# Patient Record
Sex: Male | Born: 1980 | Race: White | Hispanic: No | Marital: Single | Smoking: Current every day smoker
Health system: Southern US, Community
[De-identification: ages and names within clinical notes are randomized; demographics above are authoritative.]

## PROBLEM LIST (undated history)

## (undated) DIAGNOSIS — F329 Major depressive disorder, single episode, unspecified: Secondary | ICD-10-CM

## (undated) DIAGNOSIS — Z720 Tobacco use: Secondary | ICD-10-CM

## (undated) DIAGNOSIS — F32A Depression, unspecified: Secondary | ICD-10-CM

## (undated) DIAGNOSIS — F1010 Alcohol abuse, uncomplicated: Secondary | ICD-10-CM

## (undated) DIAGNOSIS — I10 Essential (primary) hypertension: Secondary | ICD-10-CM

## (undated) HISTORY — DX: Essential (primary) hypertension: I10

## (undated) HISTORY — DX: Major depressive disorder, single episode, unspecified: F32.9

## (undated) HISTORY — DX: Tobacco use: Z72.0

## (undated) HISTORY — DX: Depression, unspecified: F32.A

## (undated) HISTORY — DX: Alcohol abuse, uncomplicated: F10.10

---
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
## 2001-06-21 ENCOUNTER — Emergency Department (HOSPITAL_COMMUNITY): Admission: EM | Admit: 2001-06-21 | Discharge: 2001-06-21 | Payer: Self-pay | Admitting: Emergency Medicine

## 2006-08-11 ENCOUNTER — Emergency Department (HOSPITAL_COMMUNITY): Admission: EM | Admit: 2006-08-11 | Discharge: 2006-08-12 | Payer: Self-pay | Admitting: Emergency Medicine

## 2008-12-15 ENCOUNTER — Emergency Department (HOSPITAL_COMMUNITY): Admission: EM | Admit: 2008-12-15 | Discharge: 2008-12-15 | Payer: Self-pay | Admitting: Emergency Medicine

## 2010-08-19 LAB — RAPID URINE DRUG SCREEN, HOSP PERFORMED
Amphetamines: NOT DETECTED
Barbiturates: NOT DETECTED
Benzodiazepines: NOT DETECTED
Cocaine: NOT DETECTED
Opiates: NOT DETECTED
Tetrahydrocannabinol: POSITIVE — AB

## 2010-08-19 LAB — CBC
HCT: 48 % (ref 39.0–52.0)
Hemoglobin: 16.8 g/dL (ref 13.0–17.0)
MCHC: 35 g/dL (ref 30.0–36.0)
MCV: 88 fL (ref 78.0–100.0)
Platelets: 212 10*3/uL (ref 150–400)
RBC: 5.45 MIL/uL (ref 4.22–5.81)
RDW: 12.9 % (ref 11.5–15.5)
WBC: 5.7 10*3/uL (ref 4.0–10.5)

## 2010-08-19 LAB — DIFFERENTIAL
Basophils Absolute: 0 10*3/uL (ref 0.0–0.1)
Basophils Relative: 0 % (ref 0–1)
Eosinophils Absolute: 0.1 10*3/uL (ref 0.0–0.7)
Eosinophils Relative: 1 % (ref 0–5)
Lymphocytes Relative: 28 % (ref 12–46)
Lymphs Abs: 1.6 10*3/uL (ref 0.7–4.0)
Monocytes Absolute: 0.6 10*3/uL (ref 0.1–1.0)
Monocytes Relative: 10 % (ref 3–12)
Neutro Abs: 3.5 10*3/uL (ref 1.7–7.7)
Neutrophils Relative %: 61 % (ref 43–77)

## 2010-08-19 LAB — COMPREHENSIVE METABOLIC PANEL
ALT: 45 U/L (ref 0–53)
AST: 34 U/L (ref 0–37)
Albumin: 4.8 g/dL (ref 3.5–5.2)
Alkaline Phosphatase: 60 U/L (ref 39–117)
BUN: 7 mg/dL (ref 6–23)
CO2: 27 mEq/L (ref 19–32)
Calcium: 10 mg/dL (ref 8.4–10.5)
Chloride: 104 mEq/L (ref 96–112)
Creatinine, Ser: 0.92 mg/dL (ref 0.4–1.5)
GFR calc Af Amer: >60 mL/min (ref >60)
GFR calc non Af Amer: >60 mL/min (ref >60)
Glucose, Bld: 96 mg/dL (ref 70–99)
Potassium: 4.2 mEq/L (ref 3.5–5.1)
Sodium: 139 mEq/L (ref 135–145)
Total Bilirubin: 1 mg/dL (ref 0.3–1.2)
Total Protein: 7.5 g/dL (ref 6.0–8.3)

## 2010-08-19 LAB — ETHANOL: Alcohol, Ethyl (B): <5 mg/dL (ref 0–10)

## 2012-09-15 LAB — LIPID PANEL
Cholesterol: 240 — AB (ref 0–200)
HDL: 41 (ref 35–70)
LDL Cholesterol: 125
Triglycerides: 460 — AB (ref 40–160)

## 2012-09-15 LAB — HEPATIC FUNCTION PANEL
ALT: 55 — AB (ref 10–40)
AST: 26 (ref 14–40)
Alkaline Phosphatase: 56 (ref 25–125)
Bilirubin, Total: 0.6

## 2012-09-15 LAB — HM HIV SCREENING LAB: HM HIV Screening: NEGATIVE

## 2012-09-15 LAB — BASIC METABOLIC PANEL
BUN: 12 (ref 4–21)
Creatinine: 1 (ref 0.6–1.3)
Glucose: 92
Potassium: 4.4 (ref 3.4–5.3)
Sodium: 138 (ref 137–147)

## 2018-05-15 ENCOUNTER — Ambulatory Visit: Payer: Self-pay | Admitting: Family Medicine

## 2018-05-15 ENCOUNTER — Encounter: Payer: Self-pay | Admitting: Family Medicine

## 2018-05-15 VITALS — BP 122/84 | HR 91 | Temp 98.3°F | Ht 70.0 in | Wt 192.0 lb

## 2018-05-15 DIAGNOSIS — F419 Anxiety disorder, unspecified: Secondary | ICD-10-CM

## 2018-05-15 DIAGNOSIS — I10 Essential (primary) hypertension: Secondary | ICD-10-CM

## 2018-05-15 DIAGNOSIS — Z7289 Other problems related to lifestyle: Secondary | ICD-10-CM

## 2018-05-15 DIAGNOSIS — F17200 Nicotine dependence, unspecified, uncomplicated: Secondary | ICD-10-CM | POA: Insufficient documentation

## 2018-05-15 DIAGNOSIS — F1090 Alcohol use, unspecified, uncomplicated: Secondary | ICD-10-CM | POA: Insufficient documentation

## 2018-05-15 DIAGNOSIS — R079 Chest pain, unspecified: Secondary | ICD-10-CM

## 2018-05-15 DIAGNOSIS — J329 Chronic sinusitis, unspecified: Secondary | ICD-10-CM

## 2018-05-15 DIAGNOSIS — Z789 Other specified health status: Secondary | ICD-10-CM | POA: Insufficient documentation

## 2018-05-15 MED ORDER — PROPRANOLOL HCL 10 MG PO TABS: 10.0000 mg | ORAL_TABLET | Freq: Three times a day (TID) | ORAL | 1 refills | Status: DC | PRN

## 2018-05-15 MED ORDER — AMLODIPINE BESYLATE 10 MG PO TABS: 10.0000 mg | ORAL_TABLET | Freq: Every day | ORAL | 3 refills | Status: DC

## 2018-05-15 NOTE — Assessment & Plan Note (Signed)
Continue Zoloft 50 mg daily.  Will start low-dose propranolol milligrams 3 times daily as needed for anxiety provoking situations.  Recommended against benzodiazepines.

## 2018-05-15 NOTE — Assessment & Plan Note (Signed)
Patient smokes about a pack per day.  He is pre-contemplative.  Encouraged cessation today.

## 2018-05-15 NOTE — Assessment & Plan Note (Signed)
At goal today.  Home readings typically can be into the 130s to 140s over 80s to 90s.  We will increase his Norvasc to 10 mg daily.  Discussed potential side effects.  He will continue home blood pressure monitoring with goal 140/90 or lower.

## 2018-05-15 NOTE — Assessment & Plan Note (Signed)
Encourage cessation.  Patient is pre-contemplative.

## 2018-05-15 NOTE — Patient Instructions (Signed)
It was very nice to see you today!  Please start the amlodipine 10mg  daily. I will send in propranolol for you to use as needed.  Your EKG today is normal. You do not have any signs of heart daage. We should probably recheck your cholesterol levels soon.  Keep an eye on your BPs and let me know if persistently 140/90 or higher.  Come back to see me in 6-12 months, or sooner as needed.   Take care, Dr [name]

## 2018-05-15 NOTE — Progress Notes (Signed)
Subjective:  Brent Washington is a 38 y.o. male who presents today with a chief complaint of hypertension and to establish care.   HPI:  Hypertension, chronic problem, new to provider Patient has a several year history of elevated blood pressure.  He has been off his medication for several months.  Recently had to have dental work done was not able to get done due to elevated blood pressure readings.  He was evaluated at the emergency department about 6 weeks ago and was restarted on amlodipine 5 mg daily.  He has done well with this without reported side effects.  He has had a few intermittent episodes of left-sided chest pain.  This occurs randomly.  Not associated with exertion.  No nausea or vomiting.  No shortness of breath.  No treatments tried.  Anxiety, chronic problem, new to provider Follows with [facility] for this.  He is currently on Zoloft 50 mg daily.  He has not been able to tolerate higher doses due to sexual side effects.  He notices that symptoms seem to worsen when he is in specific situations such as driving along [place] roads.  He is interested in starting an as needed medication.  He has been on Xanax in the past which worked well for him.  Sinus Congestion Started about a day ago.  Improving over that time.  No fevers or chills.  No treatments tried.  Nicotine dependence Patient smokes about 1 pack/day.  He is not interested in quitting.  Alcohol use Patient also drinks 5-6 drinks per day.  He is not interested in cutting back.  Does not feel guilty.  Does not need an eye opener.  ROS: Per HPI, otherwise a complete review of systems was negative.   PMH:  The following were reviewed and entered/updated in epic: Past Medical History:  Diagnosis Date  . Depression   . Hypertension    Patient Active Problem List   Diagnosis Date Noted  . Essential hypertension 05/15/2018  . Anxiety 05/15/2018  . Nicotine dependence with current use 05/15/2018  . Alcohol  use 05/15/2018   History reviewed. No pertinent surgical history.  Family History  Problem Relation Age of Onset  . Hypertension Mother   . Depression Mother   . Early death Father   . Heart disease Father   . Hypertension Father     Medications- reviewed and updated Current Outpatient Medications  Medication Sig Dispense Refill  . sertraline (ZOLOFT) 50 MG tablet Take 50 mg by mouth daily.    Marland Kitchen amLODipine (NORVASC) 10 MG tablet Take 1 tablet (10 mg total) by mouth daily. 90 tablet 3  . propranolol (INDERAL) 10 MG tablet Take 1 tablet (10 mg total) by mouth 3 (three) times daily as needed (anxiety). 90 tablet 1   No current facility-administered medications for this visit.     Allergies-reviewed and updated No Known Allergies  Social History   Socioeconomic History  . Marital status: Single    Spouse name: Not on file  . Number of children: Not on file  . Years of education: Not on file  . Highest education level: Not on file  Occupational History  . Not on file  Social Needs  . Financial resource strain: Not on file  . Food insecurity:    Worry: Not on file    Inability: Not on file  . Transportation needs:    Medical: Not on file    Non-medical: Not on file  Tobacco Use  .  Smoking status: Current Every Day Smoker  . Smokeless tobacco: Current User    Types: Snuff, Chew  Substance and Sexual Activity  . Alcohol use: Yes    Alcohol/week: 20.0 standard drinks    Types: 20 Shots of liquor per week  . Drug use: Never  . Sexual activity: Yes  Lifestyle  . Physical activity:    Days per week: Not on file    Minutes per session: Not on file  . Stress: Not on file  Relationships  . Social connections:    Talks on phone: Not on file    Gets together: Not on file    Attends religious service: Not on file    Active member of club or organization: Not on file    Attends meetings of clubs or organizations: Not on file    Relationship status: Not on file  Other  Topics Concern  . Not on file  Social History Narrative  . Not on file     Objective:  Physical Exam: BP 122/84 (BP Location: Left Arm, Patient Position: Sitting, Cuff Size: Large)   Pulse 91   Temp 98.3 F (36.8 C) (Oral)   Ht 5\' 10"  (1.778 m)   Wt 192 lb (87.1 kg)   SpO2 98%   BMI 27.55 kg/m   Gen: NAD, resting comfortably HEENT: TMs clear.  Nose mucosa slightly erythematous with no exudate. CV: RRR with no murmurs appreciated Pulm: NWOB, CTAB with no crackles, wheezes, or rhonchi GI: Normal bowel sounds present. Soft, Nontender, Nondistended. MSK: No edema, cyanosis, or clubbing noted Skin: Warm, dry Neuro: Grossly normal, moves all extremities Psych: Normal affect and thought content  EKG: Normal sinus rhythm.  No ischemic changes.  Assessment/Plan:  Nicotine dependence with current use Patient smokes about a pack per day.  He is pre-contemplative.  Encouraged cessation today.  Essential hypertension At goal today.  Home readings typically can be into the 130s to 140s over 80s to 90s.  We will increase his Norvasc to 10 mg daily.  Discussed potential side effects.  He will continue home blood pressure monitoring with goal 140/90 or lower.  Anxiety Continue Zoloft 50 mg daily.  Will start low-dose propranolol milligrams 3 times daily as needed for anxiety provoking situations.  Recommended against benzodiazepines.  Alcohol use Encourage cessation.  Patient is pre-contemplative.  Chest pain Atypical history.  EKG today reassuring.  Unlikely to be cardiac based on history and lack of other risk factors.  Possibly musculoskeletal or anxiety driven.  Discussed reasons to return to care and seek emergent care.  Sinusitis Likely viral URI.  No red flags.  Continue with watchful waiting.  Dissipate full recovery in the next 5 to 7 days.  Katina Degree. [name], MD 05/15/2018 5:39 PM

## 2018-05-25 ENCOUNTER — Ambulatory Visit: Payer: Self-pay | Admitting: Family Medicine

## 2018-06-02 ENCOUNTER — Encounter: Payer: Self-pay | Admitting: Physical Therapy

## 2018-07-08 ENCOUNTER — Other Ambulatory Visit: Payer: Self-pay | Admitting: Family Medicine

## 2018-07-08 MED ORDER — SERTRALINE HCL 50 MG PO TABS: 50.0000 mg | ORAL_TABLET | Freq: Every day | ORAL | 1 refills | Status: DC

## 2018-07-08 NOTE — Telephone Encounter (Signed)
Copied from CRM [id number] [id number]. Topic: Quick Communication - Rx Refill/Question >> Jul 08, 2018  8:56 AM Fanny Bien wrote: Medication: sertraline (ZOLOFT) 50 MG tablet [[id number]]   Has the patient contacted their pharmacy? no Preferred Pharmacy (with phone number or street name):[facility] [address] [place], [place] - [address] [phone] (Phone) [phone] (Fax)    Agent: Please be advised that RX refills may take up to 3 business days. We ask that you follow-up with your pharmacy.

## 2018-07-08 NOTE — Telephone Encounter (Signed)
Requested medication (s) are due for refill today -yes  Requested medication (s) are on the active medication list -yes  Future visit scheduled -no  Last refill: 1 month ago   Notes to clinic: Patient is requesting refill of historical medication- sent to PCP for review of request  Requested Prescriptions  Pending Prescriptions Disp Refills   sertraline (ZOLOFT) 50 MG tablet      Sig: Take 1 tablet (50 mg total) by mouth daily.     Psychiatry:  Antidepressants - SSRI Passed - 07/08/2018  8:59 AM      Passed - Valid encounter within last 6 months    Recent Outpatient Visits          1 month ago Chest pain, unspecified type   [facility], Katina Degree, MD              Requested Prescriptions  Pending Prescriptions Disp Refills   sertraline (ZOLOFT) 50 MG tablet      Sig: Take 1 tablet (50 mg total) by mouth daily.     Psychiatry:  Antidepressants - SSRI Passed - 07/08/2018  8:59 AM      Passed - Valid encounter within last 6 months    Recent Outpatient Visits          1 month ago Chest pain, unspecified type   [facility], Katina Degree, MD

## 2018-07-08 NOTE — Telephone Encounter (Signed)
See note

## 2018-10-19 ENCOUNTER — Encounter: Payer: Self-pay | Admitting: Family Medicine

## 2018-10-19 ENCOUNTER — Ambulatory Visit (INDEPENDENT_AMBULATORY_CARE_PROVIDER_SITE_OTHER): Payer: HRSA Program | Admitting: Family Medicine

## 2018-10-19 ENCOUNTER — Ambulatory Visit: Payer: Self-pay

## 2018-10-19 VITALS — Temp 98.3°F

## 2018-10-19 DIAGNOSIS — Z20822 Contact with and (suspected) exposure to covid-19: Secondary | ICD-10-CM

## 2018-10-19 DIAGNOSIS — Z20828 Contact with and (suspected) exposure to other viral communicable diseases: Secondary | ICD-10-CM

## 2018-10-19 DIAGNOSIS — R509 Fever, unspecified: Secondary | ICD-10-CM

## 2018-10-19 NOTE — Telephone Encounter (Signed)
FYI

## 2018-10-19 NOTE — Telephone Encounter (Signed)
Pt called stating that he has had a positive exposure to COVID-19. He states he has been camping with this person. Last contact was yesterday. He has a cough, sweats, unsure of fever. SOB. Per protocol call was transferred to office for scheduling. Care advice was read to patient. Patient verbalized understanding of all instructions.  Reason for Disposition . [1] COVID-19 infection suspected by caller or triager AND [2] mild symptoms (cough, fever, or others) AND [6] no complications or SOB  Answer Assessment - Initial Assessment Questions 1. CLOSE CONTACT: "Who is the person with the confirmed or suspected COVID-19 infection that you were exposed to?"     friend 2. PLACE of CONTACT: "Where were you when you were exposed to COVID-19?" (e.g., home, school, medical waiting room; which city?)     home 3. TYPE of CONTACT: "How much contact was there?" (e.g., sitting next to, live in same house, work in same office, same building)    Sitting with eating with person 4. DURATION of CONTACT: "How long were you in contact with the COVID-19 patient?" (e.g., a few seconds, passed by person, a few minutes, live with the patient)     Several days camping trip 5. DATE of CONTACT: "When did you have contact with a COVID-19 patient?" (e.g., how many days ago)    yesterday 6. TRAVEL: "Have you traveled out of the country recently?" If so, "When and where?"     * Also ask about out-of-state travel, since the CDC has identified some high-risk cities for community spread in the [place].     * Note: Travel becomes less relevant if there is widespread community transmission where the patient lives.    No 7. COMMUNITY SPREAD: "Are there lots of cases of COVID-19 (community spread) where you live?" (See public health department website, if unsure)      [place] 8. SYMPTOMS: "Do you have any symptoms?" (e.g., fever, cough, breathing difficulty)    Yes all 9. PREGNANCY OR POSTPARTUM: "Is there any chance you are  pregnant?" "When was your last menstrual period?" "Did you deliver in the last 2 weeks?"     N/A 10. HIGH RISK: "Do you have any heart or lung problems? Do you have a weak immune system?" (e.g., CHF, COPD, asthma, HIV positive, chemotherapy, renal failure, diabetes mellitus, sickle cell anemia)       No  Answer Assessment - Initial Assessment Questions 1. COVID-19 DIAGNOSIS: "Who made your Coronavirus (COVID-19) diagnosis?" "Was it confirmed by a positive lab test?" If not diagnosed by a HCP, ask "Are there lots of cases (community spread) where you live?" (See public health department website, if unsure)     yes 2. ONSET: "When did the COVID-19 symptoms start?"      2 days 3. WORST SYMPTOM: "What is your worst symptom?" (e.g., cough, fever, shortness of breath, muscle aches)     Fever  4. COUGH: "Do you have a cough?" If so, ask: "How bad is the cough?"       yes 5. FEVER: "Do you have a fever?" If so, ask: "What is your temperature, how was it measured, and when did it start?"     Just sweats 6. RESPIRATORY STATUS: "Describe your breathing?" (e.g., shortness of breath, wheezing, unable to speak)      wheezes 7. BETTER-SAME-WORSE: "Are you getting better, staying the same or getting worse compared to yesterday?"  If getting worse, ask, "In what way?"     same 8. HIGH RISK DISEASE: "  Do you have any chronic medical problems?" (e.g., asthma, heart or lung disease, weak immune system, etc.)     No 9. PREGNANCY: "Is there any chance you are pregnant?" "When was your last menstrual period?"     N/A 10. OTHER SYMPTOMS: "Do you have any other symptoms?"  (e.g., chills, fatigue, headache, loss of smell or taste, muscle pain, sore throat)      no  Protocols used: CORONAVIRUS (COVID-19) DIAGNOSED OR SUSPECTED-A-AH, CORONAVIRUS (COVID-19) EXPOSURE-A-AH

## 2018-10-19 NOTE — Progress Notes (Signed)
    Chief Complaint:  Brent Washington is a 38 y.o. male who presents for a telephone visit with a chief complaint of fever.   Assessment/Plan:  Fever/COVID-19 exposure Symptoms consistent with COVID-19 infection.  Discussed supportive care and reasons to return to care/seek emergent care.  Will send for COVID-19 testing.    Subjective:  HPI:  Fever Started about a week ago. Was recently on a camping trip with friends and one was recently tested positive for COVID.  No cough or shortness of breath.  Some swelling.  Also some increasing fatigue.  No treatments tried.  ROS: Per HPI  PMH: He reports that he has been smoking. His smokeless tobacco use includes snuff and chew. He reports current alcohol use of about 20.0 standard drinks of alcohol per week. He reports that he does not use drugs.      Objective/Observations   NAD, speaking in full sentences.   Telephone Visit   I connected with Brent Washington on 10/19/18 at  3:40 PM EDT via telephone and verified that I am speaking with the correct person using two identifiers. I discussed the limitations of evaluation and management by telemedicine and the availability of in person appointments. The patient expressed understanding and agreed to proceed.   Patient location: Home Provider location: [place] participating in the virtual visit: Myself and Patient     Algis Greenhouse. [name], MD 10/19/2018 3:55 PM

## 2018-10-19 NOTE — Telephone Encounter (Signed)
See note. Patient scheduled for today.

## 2018-10-20 ENCOUNTER — Telehealth: Payer: Self-pay | Admitting: *Deleted

## 2018-10-20 DIAGNOSIS — Z20822 Contact with and (suspected) exposure to covid-19: Secondary | ICD-10-CM

## 2018-10-20 NOTE — Telephone Encounter (Signed)
Patient is calling to report he had visit yesterday and was supposed to have testing ordered- per OV note- he is correct- testing ordered per note. PCP will be made aware of call.

## 2018-10-20 NOTE — Telephone Encounter (Signed)
See note

## 2018-10-21 ENCOUNTER — Other Ambulatory Visit: Payer: Self-pay

## 2018-10-21 DIAGNOSIS — Z20822 Contact with and (suspected) exposure to covid-19: Secondary | ICD-10-CM

## 2018-10-21 NOTE — Telephone Encounter (Signed)
Noted  

## 2018-10-22 LAB — NOVEL CORONAVIRUS, NAA: SARS-CoV-2, NAA: NOT DETECTED

## 2018-10-27 ENCOUNTER — Ambulatory Visit: Payer: Self-pay | Admitting: Family Medicine

## 2018-10-27 ENCOUNTER — Telehealth: Payer: Self-pay | Admitting: Family Medicine

## 2018-10-27 NOTE — Telephone Encounter (Signed)
Needs letter to go back to work after testing negative for the COVID-19.  Line was busy at the office with 4 attempts so note sent to the office for them to call him back.   Phone number verified as correct in chart.  I sent this note to Dr. Marigene Ehlers office.

## 2018-10-27 NOTE — Telephone Encounter (Signed)
Patient called and asked could he receive a copy of his covid results via email for his job, I advised no email due to security and privacy. He asked could he pick a copy up at the office, I called and spoke to [place], [facility] and she says to let the patient know to come before 4 pm tomorrow and to wear a mask. I advised the patient, he verbalized understanding.

## 2018-10-28 NOTE — Telephone Encounter (Signed)
Work excuse and lab work printed and completed and ready for pick up

## 2018-12-10 ENCOUNTER — Telehealth: Payer: Self-pay

## 2018-12-10 ENCOUNTER — Ambulatory Visit: Payer: Self-pay | Admitting: Internal Medicine

## 2018-12-10 NOTE — Telephone Encounter (Signed)
I spoke with patient and he informed me that he has been working in the sun for the past few days.  Patient explained that he is having some lightheadedness and light chest pains,  No SOB and no dizziness.   Patient is currently at home today laying down out of the sun.  Patient said that he is also feeling tired.  I informed pt to go to the Urgent Care for further evaluation of symptoms.

## 2018-12-10 NOTE — Telephone Encounter (Signed)
Please ask pt to schedule OV ASAP.  Brent Washington. [name], MD 12/10/2018 4:44 PM

## 2018-12-11 NOTE — Telephone Encounter (Signed)
Left voice message for patient to call clinic to schedule appt

## 2018-12-14 ENCOUNTER — Other Ambulatory Visit: Payer: Self-pay

## 2018-12-14 ENCOUNTER — Telehealth: Payer: Self-pay

## 2018-12-14 DIAGNOSIS — R9431 Abnormal electrocardiogram [ECG] [EKG]: Secondary | ICD-10-CM

## 2018-12-14 NOTE — Telephone Encounter (Signed)
Ok with me. Please place any necessary orders. 

## 2018-12-14 NOTE — Telephone Encounter (Signed)
Spoke with patient he was seen at [facility] urgent care,His EKG stated Incomplete right bundle branch block. They informed him to see a cardiologist.Patient wants a referral then he will follow up with Dr. [name] after seeing the specialist. Also medication was changed to amlodipine-benazepril.

## 2018-12-14 NOTE — Telephone Encounter (Signed)
Copied from [place] [id number] [id number]. Topic: General - Other >> Dec 10, 2018 10:14 AM Keene Breath wrote: Reason for CRM: Patient called to schedule an appt. With Dr. [name].  Tried the office but there was no answer.  Please call patient to schedule

## 2018-12-14 NOTE — Telephone Encounter (Signed)
Referral placed.

## 2018-12-14 NOTE — Telephone Encounter (Signed)
Patient said someone has handled this for him already. No further action required. No need to route

## 2019-01-03 NOTE — Progress Notes (Signed)
Cardiology Office Note:   Date:  01/04/2019  NAME:  Brent ApleyChristopher J Washington    MRN: [id number] DOB:  03/24/1981   PCP:  Ardith DarkParker, Caleb M, MD  Cardiologist:  Reatha HarpsWesley T O'Neal, MD  Electrophysiologist:  None   Referring MD: Ardith DarkParker, Caleb M, MD   Chief Complaint  Patient presents with   Chest Pain    History of Present Illness:   Brent Washington is a 38 y.o. male with a hx of HTN, depression, tobacco abuse, etoh abuse who is being seen today for the evaluation of chest pain at the request of Ardith DarkParker, Caleb M, MD.  He reports he was seen in urgent care 6 weeks ago for an episode of sharp substernal chest pain.  The pain reported to occur while he was working on [employer] site during a hot day.  The pain lasted with him constantly for 4 days and then subsided with blood pressure medication.  He reports he was taking amlodipine 10 mg daily prior to this.  He was recently started on amlodipine and benazepril combination therapy.  He reports no associated shortness of breath.  He states he was able to continue working in [employer], doing heavy lifting and walking up stairs without any limitations.  He does have a rather heavy history of smoking nearly 1 pack a day for 20 years.  He also consumes 5-6 alcoholic beverages daily, but describes himself as not an alcoholic.  His blood pressure has been relatively labile, but seems to be much better today since he has refrained from alcohol over the weekend.  He reports a father who had a heart attack in his early 3460s as well as an uncle who had a heart attack in his 3850s.  He reports concern over the chest pain he had as well as his family history of myocardial infarction.  His ECG today shows an RSR' pattern, which is a normal finding.  There is no evidence of prior myocardial infarction or ischemic changes on ECG.  Of note his chest pain is resolved and has had no further episodes.  He does report issues with anxiety, depression and insomnia.  He is being  treated by his primary care physician.  Past Medical History: Past Medical History:  Diagnosis Date   Depression    Hypertension     Past Surgical History: History reviewed. No pertinent surgical history.  Current Medications: Current Meds  Medication Sig   amLODipine-benazepril (LOTREL) 10-20 MG capsule TAKE 1 CAPSULE BY MOUTH ONCE DAILY FOR 30 DAYS   aspirin EC 81 MG tablet Take by mouth.   sertraline (ZOLOFT) 50 MG tablet Take 1 tablet (50 mg total) by mouth daily.     Allergies:    Wellbutrin [bupropion]   Social History: Social History   Socioeconomic History   Marital status: Single    Spouse name: Not on file   Number of children: Not on file   Years of education: Not on file   Highest education level: Not on file  Occupational History   Not on file  Social Needs   Financial resource strain: Not on file   Food insecurity    Worry: Not on file    Inability: Not on file   Transportation needs    Medical: Not on file    Non-medical: Not on file  Tobacco Use   Smoking status: Current Every Day Smoker    Years: 20.00   Smokeless tobacco: Current User    Types: Snuff, Dorna BloomChew  Substance and Sexual Activity   Alcohol use: Yes    Alcohol/week: 20.0 standard drinks    Types: 20 Shots of liquor per week   Drug use: Never   Sexual activity: Yes  Lifestyle   Physical activity    Days per week: Not on file    Minutes per session: Not on file   Stress: Not on file  Relationships   Social connections    Talks on phone: Not on file    Gets together: Not on file    Attends religious service: Not on file    Active member of club or organization: Not on file    Attends meetings of clubs or organizations: Not on file    Relationship status: Not on file  Other Topics Concern   Not on file  Social History Narrative   Not on file     Family History: The patient's family history includes Depression in his mother; Early death in his father;  Heart disease in his father and maternal uncle; Hypertension in his father and mother.  ROS:   All other ROS reviewed and negative. Pertinent positives noted in the HPI.     EKGs/Labs/Other Studies Reviewed:   The following studies were personally reviewed by me today:  EKG:  EKG is ordered today.  The ekg ordered today demonstrates normal sinus rhythm, heart rate 91, normal intervals, rSR' pattern, no ST-T changes to suggest ischemia no infarction, and was personally reviewed by me.   Recent Labs: No results found for requested labs within last 8760 hours.   Recent Lipid Panel    Component Value Date/Time   CHOL 240 (A) 09/15/2012   TRIG 460 (A) 09/15/2012   HDL 41 09/15/2012   LDLCALC 125 09/15/2012    Physical Exam:   VS:  BP 130/76    Pulse 91    Ht 5\' 10"  (1.778 m)    Wt 184 lb 12.8 oz (83.8 kg)    SpO2 97%    BMI 26.52 kg/m    Wt Readings from Last 3 Encounters:  01/04/19 184 lb 12.8 oz (83.8 kg)  05/15/18 192 lb (87.1 kg)    General: Well nourished, well developed, in no acute distress Heart: Atraumatic, normal size  Eyes: PEERLA, EOMI  Neck: Supple, no JVD Endocrine: No thryomegaly Cardiac: Normal S1, S2; RRR; no murmurs, rubs, or gallops Lungs: Clear to auscultation bilaterally, no wheezing, rhonchi or rales  Abd: Soft, nontender, no hepatomegaly  Ext: No edema, pulses 2+ Musculoskeletal: No deformities, BUE and BLE strength normal and equal Skin: Warm and dry, no rashes   Neuro: Alert and oriented to person, place, time, and situation, CNII-XII grossly intact, no focal deficits  Psych: Normal mood and affect   ASSESSMENT:   NAME@ is a 38 y.o. male who presents for the following: 1. Chest pain of uncertain etiology   2. Nonspecific abnormal electrocardiogram (ECG) (EKG)   3. Tobacco abuse   4. Alcohol abuse   5. Essential hypertension     PLAN:   1. Chest pain of uncertain etiology 2. Nonspecific abnormal electrocardiogram (ECG) (EKG) -He presents with  rather atypical chest pain, that has resolved.  It seems to be related to the stress of the hot day, as well as his known anxiety/depression disorder.  He has had no further recurrence of this chest pain, and maintains a rather high level of exertional capacity without symptoms of chest pain, shortness of breath or other symptoms.  His ECG shows an  RSR prime pattern, which is a normal variant and of no underlying pathological consequence.  He has no ischemic changes or evidence of prior infarction on his ECG today.  He is examination is extremely benign, and I hear no murmurs rubs or gallops.  He has no evidence of underlying congestive heart failure on my examination. -I explained to him thoroughly that his symptoms are likely related to physical stress including mental stress and I have encouraged him to continue exercise and to destress as much as possible -I will see him on an as-needed basis if his symptoms return or if he has further concerns  3. Tobacco abuse -Smoking cessation counseling provided today  4. Alcohol abuse -He is drinking nearly 5-6 alcoholic beverages per day and this is too much -This is likely contributing to his hypertension and he was advised to cut down immediately  5. Essential hypertension -I suspect this is secondary to tobacco abuse as well as alcohol abuse; cessation advised   Disposition: Return if symptoms worsen or fail to improve.  Medication Adjustments/Labs and Tests Ordered: Current medicines are reviewed at length with the patient today.  Concerns regarding medicines are outlined above.  Orders Placed This Encounter  Procedures   EKG 12-Lead   No orders of the defined types were placed in this encounter.  Patient Instructions  Medication Instructions:  No changes  Lab work:  Not needed  Testing/Procedures: Not needed  Follow-Up: At [facility], you and your health needs are our priority.  As part of our continuing mission to provide  you with exceptional heart care, we have created designated Provider Care Teams.  These Care Teams include your primary Cardiologist (physician) and Advanced Practice Providers (APPs -  Physician Assistants and Nurse Practitioners) who all work together to provide you with the care you need, when you need it.  Dr Audie Box recommends that you schedule a follow-up appointment on an as needed basis     Any Other Special Instructions Will Be Listed Below. n/a    Carolann Littler T. Audie Box, Bray  [address], [place], [place] [address] [phone]  01/04/2019 3:36 PM

## 2019-01-04 ENCOUNTER — Ambulatory Visit (INDEPENDENT_AMBULATORY_CARE_PROVIDER_SITE_OTHER): Payer: Self-pay | Admitting: Cardiovascular Disease

## 2019-01-04 ENCOUNTER — Other Ambulatory Visit: Payer: Self-pay

## 2019-01-04 ENCOUNTER — Encounter: Payer: Self-pay | Admitting: Cardiovascular Disease

## 2019-01-04 VITALS — BP 130/76 | HR 91 | Ht 70.0 in | Wt 184.8 lb

## 2019-01-04 DIAGNOSIS — R9431 Abnormal electrocardiogram [ECG] [EKG]: Secondary | ICD-10-CM

## 2019-01-04 DIAGNOSIS — R0789 Other chest pain: Secondary | ICD-10-CM

## 2019-01-04 DIAGNOSIS — Z720 Tobacco use: Secondary | ICD-10-CM

## 2019-01-04 DIAGNOSIS — R079 Chest pain, unspecified: Secondary | ICD-10-CM

## 2019-01-04 DIAGNOSIS — F1010 Alcohol abuse, uncomplicated: Secondary | ICD-10-CM

## 2019-01-04 DIAGNOSIS — I10 Essential (primary) hypertension: Secondary | ICD-10-CM

## 2019-01-04 NOTE — Patient Instructions (Signed)
Medication Instructions:  No changes  Lab work:  Not needed  Testing/Procedures: Not needed  Follow-Up: At [facility], you and your health needs are our priority.  As part of our continuing mission to provide you with exceptional heart care, we have created designated Provider Care Teams.  These Care Teams include your primary Cardiologist (physician) and Advanced Practice Providers (APPs -  Physician Assistants and Nurse Practitioners) who all work together to provide you with the care you need, when you need it. . Dr Audie Box recommends that you schedule a follow-up appointment on an as needed basis     Any Other Special Instructions Will Be Listed Below. n/a

## 2019-02-08 ENCOUNTER — Ambulatory Visit: Payer: Self-pay | Admitting: Cardiology

## 2019-03-01 ENCOUNTER — Other Ambulatory Visit: Payer: Self-pay | Admitting: Family Medicine

## 2019-05-01 ENCOUNTER — Other Ambulatory Visit: Payer: Self-pay | Admitting: Family Medicine

## 2019-05-19 ENCOUNTER — Other Ambulatory Visit: Payer: Self-pay

## 2019-05-19 ENCOUNTER — Other Ambulatory Visit: Payer: Self-pay | Admitting: Family Medicine

## 2019-05-19 MED ORDER — AMLODIPINE BESYLATE 10 MG PO TABS: 10.0000 mg | ORAL_TABLET | Freq: Every day | ORAL | 0 refills | Status: DC

## 2019-08-22 ENCOUNTER — Other Ambulatory Visit: Payer: Self-pay | Admitting: Family Medicine

## 2019-11-03 ENCOUNTER — Other Ambulatory Visit: Payer: Self-pay | Admitting: Family Medicine

## 2019-11-04 MED ORDER — SERTRALINE HCL 50 MG PO TABS: 50.0000 mg | ORAL_TABLET | Freq: Every day | ORAL | 0 refills | Status: DC

## 2019-11-04 NOTE — Addendum Note (Signed)
Addended by: Jimmye Norman on: 11/04/2019 09:47 AM   Modules accepted: Orders

## 2019-11-04 NOTE — Telephone Encounter (Signed)
  LAST APPOINTMENT DATE: 10/19/2018  NEXT APPOINTMENT DATE:@Visit  date not found  MEDICATION: amLODipine-benazepril (LOTREL) 10-20 MG capsule // sertraline (ZOLOFT) 50 MG tablet  PHARMACY: [facility] [address] [place], [place] - [address]

## 2020-02-08 ENCOUNTER — Other Ambulatory Visit: Payer: Self-pay | Admitting: Family Medicine

## 2020-02-08 NOTE — Telephone Encounter (Signed)
LAST APPOINTMENT DATE: 608/2020   NEXT APPOINTMENT DATE: Visit date not found   Rx Zoloft LAST REFILL: 11/04/2019  QTY:30 Ref0

## 2020-02-14 ENCOUNTER — Other Ambulatory Visit: Payer: Self-pay | Admitting: Family Medicine

## 2020-02-15 NOTE — Telephone Encounter (Signed)
Pt has not been seen since 10/2018.

## 2020-09-05 ENCOUNTER — Emergency Department (HOSPITAL_COMMUNITY)
Admission: EM | Admit: 2020-09-05 | Discharge: 2020-09-05 | Disposition: A | Discharge status: Home / Self Care | Payer: Self-pay | Attending: Emergency Medicine | Admitting: Emergency Medicine

## 2020-09-05 ENCOUNTER — Emergency Department (HOSPITAL_COMMUNITY): Payer: Self-pay

## 2020-09-05 ENCOUNTER — Encounter (HOSPITAL_COMMUNITY): Payer: Self-pay | Admitting: *Deleted

## 2020-09-05 DIAGNOSIS — J181 Lobar pneumonia, unspecified organism: Secondary | ICD-10-CM | POA: Insufficient documentation

## 2020-09-05 DIAGNOSIS — Z79899 Other long term (current) drug therapy: Secondary | ICD-10-CM | POA: Insufficient documentation

## 2020-09-05 DIAGNOSIS — J189 Pneumonia, unspecified organism: Secondary | ICD-10-CM

## 2020-09-05 DIAGNOSIS — R0789 Other chest pain: Secondary | ICD-10-CM

## 2020-09-05 DIAGNOSIS — I10 Essential (primary) hypertension: Secondary | ICD-10-CM | POA: Insufficient documentation

## 2020-09-05 DIAGNOSIS — F17200 Nicotine dependence, unspecified, uncomplicated: Secondary | ICD-10-CM | POA: Insufficient documentation

## 2020-09-05 LAB — CBC
HCT: 50.8 % (ref 39.0–52.0)
Hemoglobin: 17.7 g/dL — ABNORMAL HIGH (ref 13.0–17.0)
MCH: 30.9 pg (ref 26.0–34.0)
MCHC: 34.8 g/dL (ref 30.0–36.0)
MCV: 88.8 fL (ref 80.0–100.0)
Platelets: 302 10*3/uL (ref 150–400)
RBC: 5.72 MIL/uL (ref 4.22–5.81)
RDW: 13.1 % (ref 11.5–15.5)
WBC: 4.7 10*3/uL (ref 4.0–10.5)
nRBC: 0 % (ref 0.0–0.2)

## 2020-09-05 LAB — BASIC METABOLIC PANEL
Anion gap: 9 (ref 5–15)
BUN: 12 mg/dL (ref 6–20)
CO2: 24 mmol/L (ref 22–32)
Calcium: 9.4 mg/dL (ref 8.9–10.3)
Chloride: 103 mmol/L (ref 98–111)
Creatinine, Ser: 0.84 mg/dL (ref 0.61–1.24)
GFR, Estimated: >60 mL/min (ref >60)
Glucose, Bld: 106 mg/dL — ABNORMAL HIGH (ref 70–99)
Potassium: 3.8 mmol/L (ref 3.5–5.1)
Sodium: 136 mmol/L (ref 135–145)

## 2020-09-05 LAB — TROPONIN I (HIGH SENSITIVITY)
Troponin I (High Sensitivity): 2 ng/L (ref <18)
Troponin I (High Sensitivity): <2 ng/L (ref <18)

## 2020-09-05 LAB — D-DIMER, QUANTITATIVE: D-Dimer, Quant: <0.27 ug/mL-FEU (ref 0.00–0.50)

## 2020-09-05 MED ORDER — AMOXICILLIN-POT CLAVULANATE 875-125 MG PO TABS
1.0000 | ORAL_TABLET | Freq: Once | ORAL | Status: AC
  Administered 2020-09-05: 1 via ORAL
  Filled 2020-09-05: qty 1

## 2020-09-05 MED ORDER — AZITHROMYCIN 250 MG PO TABS
500.0000 mg | ORAL_TABLET | Freq: Once | ORAL | Status: AC
  Administered 2020-09-05: 500 mg via ORAL
  Filled 2020-09-05: qty 2

## 2020-09-05 MED ORDER — AZITHROMYCIN 250 MG PO TABS: 250.0000 mg | ORAL_TABLET | Freq: Every day | ORAL | 0 refills | Status: AC

## 2020-09-05 MED ORDER — AMOXICILLIN-POT CLAVULANATE 875-125 MG PO TABS: 1.0000 | ORAL_TABLET | Freq: Two times a day (BID) | ORAL | 0 refills | Status: DC

## 2020-09-05 NOTE — ED Triage Notes (Signed)
Pt complains of left sided chest pain radiating down left arm, started 2 days ago but woke him up this morning. Hx of hypertension, took his losartan, amlodipine, 3 baby aspirin, half a xanax this morning prior to arrival. Also some dizziness over past couple days.

## 2020-09-05 NOTE — ED Provider Notes (Signed)
[facility]-EMERGENCY DEPT Provider Note   CSN: [id number] Arrival date & time: 09/05/20  0240     History Chief Complaint  Patient presents with  . Chest Pain    Brent Washington is a 40 y.o. male.  HPI Patient with a history of hypertension, alcohol and cigarette use now presents with chest pain.  He does note prior episodes of chest pain, but today's was different.  He notes that since yesterday he has had episodic pain in the left mid sternal region with additional periods of left arm pain.  Pain is sore, moderate.  These areas do not necessarily have pain at the same time.  There is some associated dyspnea secondary to discomfort.  No syncope, no loss of sensation anywhere.  There is some perceived difficulty with ambulation during this illness as well. He has been taking his medication regularly and took Xanax in an attempt to quell his symptoms.  This did not change anything appreciably and he presents for evaluation.    Past Medical History:  Diagnosis Date  . Alcohol abuse   . Depression   . Hypertension   . Tobacco abuse     Patient Active Problem List   Diagnosis Date Noted  . Essential hypertension 05/15/2018  . Anxiety 05/15/2018  . Nicotine dependence with current use 05/15/2018  . Alcohol use 05/15/2018    History reviewed. No pertinent surgical history.     Family History  Problem Relation Age of Onset  . Hypertension Mother   . Depression Mother   . Early death Father   . Heart disease Father   . Hypertension Father   . Heart disease Maternal Uncle     Social History   Tobacco Use  . Smoking status: Current Every Day Smoker    Years: 20.00  . Smokeless tobacco: Current User    Types: Snuff, Chew  Vaping Use  . Vaping Use: Never used  Substance Use Topics  . Alcohol use: Yes    Alcohol/week: 20.0 standard drinks    Types: 20 Shots of liquor per week  . Drug use: Never    Home Medications Prior to Admission  medications   Medication Sig Start Date End Date Taking? Authorizing Provider  amLODipine (NORVASC) 10 MG tablet Take 1 tablet by mouth once daily 11/03/19   Ardith Dark, MD  amLODipine-benazepril (LOTREL) 10-20 MG capsule TAKE 1 CAPSULE BY MOUTH ONCE DAILY FOR 30 DAYS 12/10/18   [provider]  aspirin EC 81 MG tablet Take by mouth.    [provider]  sertraline (ZOLOFT) 50 MG tablet Take 1 tablet (50 mg total) by mouth daily. 11/04/19   Ardith Dark, MD    Allergies    Wellbutrin [bupropion]  Review of Systems   Review of Systems  Constitutional:       Per HPI, otherwise negative  HENT:       Per HPI, otherwise negative  Respiratory:       Per HPI, otherwise negative  Cardiovascular:       Per HPI, otherwise negative  Gastrointestinal: Negative for vomiting.  Endocrine:       Negative aside from HPI  Genitourinary:       Neg aside from HPI   Musculoskeletal:       Per HPI, otherwise negative  Skin: Negative.   Neurological: Negative for syncope.  Psychiatric/Behavioral: The patient is nervous/anxious.     Physical Exam Updated Vital Signs BP (!) 150/108 (BP  Location: Right Arm)   Pulse 94   Temp 98.1 F (36.7 C) (Oral)   Resp 18   Ht 5\' 10"  (1.778 m)   Wt 83 kg   SpO2 99%   BMI 26.26 kg/m   Physical Exam Vitals and nursing note reviewed.  Constitutional:      General: He is not in acute distress.    Appearance: He is well-developed.  HENT:     Head: Normocephalic and atraumatic.  Eyes:     Conjunctiva/sclera: Conjunctivae normal.  Cardiovascular:     Rate and Rhythm: Normal rate and regular rhythm.  Pulmonary:     Effort: Pulmonary effort is normal. No respiratory distress.     Breath sounds: No stridor.  Abdominal:     General: There is no distension.  Skin:    General: Skin is warm and dry.  Neurological:     Mental Status: He is alert and oriented to person, place, and time.     ED Results / Procedures / Treatments    Labs (all labs ordered are listed, but only abnormal results are displayed) Labs Reviewed  BASIC METABOLIC PANEL - Abnormal; Notable for the following components:      Result Value   Glucose, Bld 106 (*)    All other components within normal limits  CBC - Abnormal; Notable for the following components:   Hemoglobin 17.7 (*)    All other components within normal limits  D-DIMER, QUANTITATIVE  TROPONIN I (HIGH SENSITIVITY)    EKG EKG Interpretation  Date/Time:  Tuesday September 05 2020 08:35:04 EDT Ventricular Rate:  90 PR Interval:  153 QRS Duration: 119 QT Interval:  370 QTC Calculation: 453 R Axis:   -14 Text Interpretation: Sinus rhythm Probable left atrial enlargement Incomplete right bundle branch block Probable anteroseptal infarct, old ST-t wave abnormality Confirmed by 1971 [phone]) on 09/05/2020 9:33:55 AM Abnormal EKG  Radiology DG Chest 2 View  Result Date: 09/05/2020 CLINICAL DATA:  Chest pain. EXAM: CHEST - 2 VIEW COMPARISON:  08/12/2006. FINDINGS: Mediastinum hilar structures normal. Heart size normal. Questionable small infiltrate/density left upper lung. Follow-up PA and lateral chest x-ray suggested in order to demonstrate resolution. No pleural effusion or pneumothorax. No acute bony abnormality. IMPRESSION: Questionable small infiltrate/density left upper lung. Follow-up PA and lateral chest x-ray suggested in order to demonstrate resolution. Electronically Signed   By: 10/12/2006  Register   On: 09/05/2020 08:56    Procedures Procedures   Medications Ordered in ED Medications - No data to display  ED Course  I have reviewed the triage vital signs and the nursing notes.  Pertinent labs & imaging results that were available during my care of the patient were reviewed by me and considered in my medical decision making (see chart for details).   Patient placed on continuous cardiac monitoring, pulse oximetry after initial evaluation.  On cardiac monitor  90s, sinus, unremarkable Pulse oximetry 99% room air normal   With consideration of chest pain, dizziness, hypertension differential including ACS, dissection, pneumonia all considered. Initial findings reassuring, initial troponin normal, D-dimer negative.  12:24 PM Patient calm, in no distress. I reviewed his, remaining labs, 2 troponins are normal, EKG is nonischemic, low suspicion for atypical ACS. D-dimer negative, low suspicion for PE.  With low aortic dissection risk score, D-dimer negative, symmetric pulses bilaterally, lower suspicion for dissection as well. Alternative cause for his pain is consistent with x-ray finding for pneumonia.  Patient amenable to initiation of antibiotics, primary care follow-up.  Patient discharged in stable condition. MDM Rules/Calculators/A&P MDM Number of Diagnoses or Management Options Atypical chest pain: new, needed workup Community acquired pneumonia of left upper lobe of lung: new, needed workup   Amount and/or Complexity of Data Reviewed Clinical lab tests: reviewed and ordered Tests in the radiology section of CPT: reviewed and ordered Tests in the medicine section of CPT: ordered and reviewed Decide to obtain previous medical records or to obtain history from someone other than the patient: yes Review and summarize past medical records: yes Independent visualization of images, tracings, or specimens: yes  Risk of Complications, Morbidity, and/or Mortality Presenting problems: high Diagnostic procedures: high Management options: high  Critical Care Total time providing critical care: < 30 minutes  Patient Progress Patient progress: stable  Final Clinical Impression(s) / ED Diagnoses Final diagnoses:  Community acquired pneumonia of left upper lobe of lung  Atypical chest pain    Rx / DC Orders ED Discharge Orders         Ordered    azithromycin (ZITHROMAX) 250 MG tablet  Daily        09/05/20 1226     amoxicillin-clavulanate (AUGMENTIN) 875-125 MG tablet  Every 12 hours        09/05/20 1226           Gerhard Munch, MD 09/05/20 1227

## 2020-09-05 NOTE — Discharge Instructions (Signed)
As discussed, you have been diagnosed with pneumonia.  Please take all antibiotics as prescribed, and schedule follow-up with your physician.  Return here for concerning changes in your condition.

## 2020-09-15 ENCOUNTER — Ambulatory Visit: Payer: Self-pay | Admitting: Family Medicine

## 2020-09-29 ENCOUNTER — Ambulatory Visit (INDEPENDENT_AMBULATORY_CARE_PROVIDER_SITE_OTHER): Payer: Self-pay

## 2020-09-29 ENCOUNTER — Encounter: Payer: Self-pay | Admitting: Family Medicine

## 2020-09-29 ENCOUNTER — Other Ambulatory Visit: Payer: Self-pay

## 2020-09-29 ENCOUNTER — Ambulatory Visit (INDEPENDENT_AMBULATORY_CARE_PROVIDER_SITE_OTHER): Payer: Self-pay | Admitting: Family Medicine

## 2020-09-29 VITALS — BP 136/93 | HR 111 | Temp 98.2°F | Ht 70.0 in | Wt 191.0 lb

## 2020-09-29 DIAGNOSIS — J189 Pneumonia, unspecified organism: Secondary | ICD-10-CM

## 2020-09-29 DIAGNOSIS — B360 Pityriasis versicolor: Secondary | ICD-10-CM | POA: Insufficient documentation

## 2020-09-29 DIAGNOSIS — L290 Pruritus ani: Secondary | ICD-10-CM

## 2020-09-29 DIAGNOSIS — I10 Essential (primary) hypertension: Secondary | ICD-10-CM

## 2020-09-29 DIAGNOSIS — N529 Male erectile dysfunction, unspecified: Secondary | ICD-10-CM

## 2020-09-29 MED ORDER — KETOCONAZOLE 2 % EX SHAM: 1.0000 "application " | MEDICATED_SHAMPOO | Freq: Every day | CUTANEOUS | 0 refills | Status: DC

## 2020-09-29 MED ORDER — TADALAFIL 10 MG PO TABS: 5.0000 mg | ORAL_TABLET | Freq: Every day | ORAL | 1 refills | Status: DC | PRN

## 2020-09-29 NOTE — Assessment & Plan Note (Signed)
No red flags.  Will start Cialis.  Discussed potential side effects. 

## 2020-09-29 NOTE — Assessment & Plan Note (Signed)
Start topical ketoconazole. 

## 2020-09-29 NOTE — Progress Notes (Signed)
   Brent Washington is a 40 y.o. male who presents today for an office visit.  Assessment/Plan:  New/Acute Problems: Pneumonia Normal exam today.  We will repeat chest x-ray per radiology recommendations.  Chronic Problems Addressed Today: Essential hypertension All the above goal though has been well controlled at home.  Continue Norvasc 10 mg daily and losartan 25 mg daily.  He will continue home monitoring with goal 140/90 or lower.  Erectile dysfunction No red flags.  Will start Cialis.  Discussed potential side effects.  Pruritus ani Recommended over-the-counter hydrocortisone and zinc oxide.  He will let me know if not improving.  Tinea versicolor Start topical ketoconazole.     Subjective:  HPI:  Patient here for ED follow-up.  Was seen in the ED about 4 weeks ago.  Was having chest pain.  Had x-ray which showed infiltrate.  He was diagnosed with pneumonia.  Started on amoxicillin and azithromycin.  Finished his antibiotics.  He has not had any recurrence of chest pain.  No shortness of breath.  No fevers or chills.  He has a few additional concerns he would like to discuss today  He has had diffuse erythematous itchy rash encompassing upper torso and upper extremities.  Usually occurs during the summer when he is out in the sun more.  No specific treatments tried  Is also had itching to anus for the last several weeks.  No specific treatments tried.  No hemorrhoids.  No bleeding.  No constipation or diarrhea  Is also has some issues with erectile dysfunction.  Ongoing.  No specific treatments tried.  No urinary symptoms.  No frequency or hesitancy.       Objective:  Physical Exam: BP (!) 136/93   Pulse (!) 111   Temp 98.2 F (36.8 C)   Ht 5\' 10"  (1.778 m)   Wt 191 lb (86.6 kg)   SpO2 98%   BMI 27.41 kg/m   Gen: No acute distress, resting comfortably CV: Regular rate and rhythm with no murmurs appreciated Pulm: Normal work of breathing, clear to  auscultation bilaterally with no crackles, wheezes, or rhonchi Skin: Diffuse erythematous rash across upper torso. Neuro: Grossly normal, moves all extremities Psych: Normal affect and thought content  Time Spent: 45 minutes of total time was spent on the date of the encounter performing the following actions: chart review prior to seeing the patient including his recent ED visit, obtaining history, performing a medically necessary exam, counseling on the treatment plan, placing orders, and documenting in our EHR.        . Katina Degree, MD 09/29/2020 12:48 PM

## 2020-09-29 NOTE — Assessment & Plan Note (Signed)
Recommended over-the-counter hydrocortisone and zinc oxide.  He will let me know if not improving.

## 2020-09-29 NOTE — Patient Instructions (Signed)
It was very nice to see you today!  We will check an x-ray today to make sure your pneumonia has cleared up.  Please start the ketoconazole for your rash.  Apply daily in the shower leave on for 5 to 10 minutes and then wash off.  He can use as needed for flareups then afterwards.  Please use cortisone cream and A&D ointment for your itching.  Apply the cortisone cream first and then later landing ointment over this.  Let me know if this does not improve.  Will start Cialis.  Please take half a pill as needed.  Please send a message in a few weeks on how this is working for you.  Take care, Dr [name]  PLEASE NOTE:  If you had any lab tests please let us know if you have not heard back within a few days. You may see your results on mychart before we have a chance to review them but we will give you a call once they are reviewed by [place]. If we ordered any referrals today, please let us know if you have not heard from their office within the next week.   Please try these tips to maintain a healthy lifestyle:   Eat at least 3 REAL meals and 1-2 snacks per day.  Aim for no more than 5 hours between eating.  If you eat breakfast, please do so within one hour of getting up.    Each meal should contain half fruits/vegetables, one quarter protein, and one quarter carbs (no bigger than a computer mouse)   Cut down on sweet beverages. This includes juice, soda, and sweet tea.     Drink at least 1 glass of water with each meal and aim for at least 8 glasses per day   Exercise at least 150 minutes every week.

## 2020-09-29 NOTE — Assessment & Plan Note (Signed)
All the above goal though has been well controlled at home.  Continue Norvasc 10 mg daily and losartan 25 mg daily.  He will continue home monitoring with goal 140/90 or lower.

## 2020-10-02 NOTE — Progress Notes (Signed)
Please inform patient of the following:  His xray is normal. Do not need to do any further testing.  Brent Washington. [name], MD 10/02/2020 8:56 AM

## 2020-10-24 ENCOUNTER — Encounter: Payer: Self-pay | Admitting: Family Medicine

## 2020-10-24 ENCOUNTER — Other Ambulatory Visit: Payer: Self-pay

## 2020-10-24 MED ORDER — SERTRALINE HCL 100 MG PO TABS: 100.0000 mg | ORAL_TABLET | Freq: Every day | ORAL | 0 refills | Status: DC

## 2020-10-31 ENCOUNTER — Other Ambulatory Visit: Payer: Self-pay | Admitting: Family Medicine

## 2020-12-04 ENCOUNTER — Other Ambulatory Visit: Payer: Self-pay | Admitting: Family Medicine

## 2020-12-11 ENCOUNTER — Encounter: Payer: Self-pay | Admitting: Family Medicine

## 2020-12-11 ENCOUNTER — Telehealth: Payer: Self-pay

## 2020-12-11 ENCOUNTER — Other Ambulatory Visit: Payer: Self-pay

## 2020-12-11 MED ORDER — LOSARTAN POTASSIUM 25 MG PO TABS: 25.0000 mg | ORAL_TABLET | Freq: Every day | ORAL | 5 refills | Status: DC

## 2020-12-11 MED ORDER — AMLODIPINE BESYLATE 10 MG PO TABS: 10.0000 mg | ORAL_TABLET | Freq: Every day | ORAL | 5 refills | Status: DC

## 2020-12-11 NOTE — Telephone Encounter (Signed)
Medications sent to pharmacy

## 2020-12-11 NOTE — Telephone Encounter (Signed)
LAST APPOINTMENT DATE:  09/29/20  NEXT APPOINTMENT DATE:None  MEDICATION:losartan (COZAAR) 25 MG tablet  amLODipine (NORVASC) 10 MG tablet  Is the patient out of medication? YES  PHARMACY:[facility] [address] [place], [place] - [address]

## 2021-01-04 ENCOUNTER — Other Ambulatory Visit: Payer: Self-pay | Admitting: Family Medicine

## 2021-03-07 ENCOUNTER — Other Ambulatory Visit: Payer: Self-pay | Admitting: Family Medicine

## 2021-03-13 ENCOUNTER — Encounter: Payer: Self-pay | Admitting: Family Medicine

## 2021-03-14 NOTE — Telephone Encounter (Signed)
Spoke with patient, advise to schedule appointment with PCP for evaluation  Advise not to take prescription not proscribed to him  Refused to be transfer to front office for appointment, Will call later to schedule appt

## 2021-03-26 ENCOUNTER — Other Ambulatory Visit: Payer: Self-pay | Admitting: Family Medicine

## 2021-03-27 ENCOUNTER — Other Ambulatory Visit: Payer: Self-pay | Admitting: Family Medicine

## 2021-04-10 ENCOUNTER — Encounter: Payer: Self-pay | Admitting: Family Medicine

## 2021-04-10 ENCOUNTER — Other Ambulatory Visit: Payer: Self-pay

## 2021-04-10 ENCOUNTER — Ambulatory Visit (INDEPENDENT_AMBULATORY_CARE_PROVIDER_SITE_OTHER): Payer: Self-pay | Admitting: Family Medicine

## 2021-04-10 VITALS — BP 147/82 | HR 113 | Temp 98.0°F | Ht 70.0 in | Wt 175.8 lb

## 2021-04-10 DIAGNOSIS — N529 Male erectile dysfunction, unspecified: Secondary | ICD-10-CM

## 2021-04-10 DIAGNOSIS — I10 Essential (primary) hypertension: Secondary | ICD-10-CM

## 2021-04-10 MED ORDER — DICLOFENAC SODIUM 75 MG PO TBEC: 75.0000 mg | DELAYED_RELEASE_TABLET | Freq: Two times a day (BID) | ORAL | 0 refills | Status: DC

## 2021-04-10 MED ORDER — TIZANIDINE HCL 4 MG PO TABS: 4.0000 mg | ORAL_TABLET | Freq: Four times a day (QID) | ORAL | 0 refills | Status: DC | PRN

## 2021-04-10 MED ORDER — HYDROCODONE-ACETAMINOPHEN 5-325 MG PO TABS: 1.0000 | ORAL_TABLET | Freq: Four times a day (QID) | ORAL | 0 refills | Status: AC | PRN

## 2021-04-10 NOTE — Progress Notes (Signed)
   Brent Washington is a 40 y.o. male who presents today for an office visit.  Assessment/Plan:  New/Acute Problems: Low Back Pain No red flags.  Consistent with muscular strain though he was having some chronic neck symptoms as well.  We will start course of diclofenac and Zanaflex.  Discussed home exercises and handout was given.  He can also continue home heating pad.  We will send in very small supply of hydrocodone.  Explained to patient this would not be a long-term prescription.  Discussed potential side effects.  Discussed x-rays today however he deferred.  He will let me know if not improving in the next 1 to 2 weeks and we can check x-ray and refer to PT and/or sports medicine that point.  Right Hand Pain Like has some mild underlying arthritis.  May have tenosynovitis as well. Will hopefully have some improvement with diclofenac as above.  Chronic Problems Addressed Today: Erectile dysfunction On Cialis 10 mg daily as needed.  No side effects though does note medications seem to be more effective the day after he takes it.  We discussed potentially switching to Viagra however deferred for today.  He will let me know if he would like to make a switch in the future.  Essential hypertension Slightly above goal though he is in acute pain today.  We will continue home monitoring.  Continue current medications Norvasc 10 mg daily and losartan 25 mg daily.     Subjective:  HPI:  Patient here with back pain. Started about a month ago. He was helping a friend move equipment around.  Lifted a gas tank and felt a pop in his right lower back.  Pain has been persistent since then.  Pain radiates into right lower extremity and into his feet.  He went to urgent care a few weeks ago.  Was given a steroid shot and a prescription for Flexeril.  Neither of these have seemed to help.  Pain is worse with certain motions including getting up or sitting down.  No reported bowel or bladder  incontinence.  No paresthesias.  He has also had some pain in his right hand.  This is been persistent for years. Worse with prolonged use.        Objective:  Physical Exam: BP (!) 147/82   Pulse (!) 113   Temp 98 F (36.7 C) (Temporal)   Ht 5\' 10"  (1.778 m)   Wt 175 lb 12.8 oz (79.7 kg)   SpO2 98%   BMI 25.22 kg/m   Gen: No acute distress, resting comfortably CV: Regular rate and rhythm with no murmurs appreciated Pulm: Normal work of breathing, clear to auscultation bilaterally with no crackles, wheezes, or rhonchi MSK: Back without deformities.  Tenderness to palpation along right lower lumbar paraspinal muscles.  Tender nodule noted - Legs: Neurovascularly intact distally.  Straight leg raise negative.  Strength 5 out of 5 throughout. - Right Hand: First MCP joint tender to palpation.  Pain elicited with axial loading.  Positive Finkelstein. Neuro: Grossly normal, moves all extremities Psych: Normal affect and thought content      Reha Martinovich M. , MD 04/10/2021 1:41 PM

## 2021-04-10 NOTE — Assessment & Plan Note (Signed)
Slightly above goal though he is in acute pain today.  We will continue home monitoring.  Continue current medications Norvasc 10 mg daily and losartan 25 mg daily.

## 2021-04-10 NOTE — Patient Instructions (Signed)
It was very nice to see you today!  Not think you have a strain in your lower back.  This might be also causing a pinched nerve.  Please take the diclofenac 75 mg twice daily for the next 1 to 2 weeks.  You should take this consistently regardless of your pain level over the next couple of weeks.  You can take Zanaflex as needed.  Please work on the exercises.  I will also send in a small supply of hydrocodone to use as needed for pain.  This is not something that we can do long term.  Please let me know if your symptoms are not improving in the next few weeks and we can have you see a physical therapist and check x-rays at that time.   Take care, Dr [name]  PLEASE NOTE:  If you had any lab tests please let us know if you have not heard back within a few days. You may see your results on mychart before we have a chance to review them but we will give you a call once they are reviewed by [place]. If we ordered any referrals today, please let us know if you have not heard from their office within the next week.   Please try these tips to maintain a healthy lifestyle:  Eat at least 3 REAL meals and 1-2 snacks per day.  Aim for no more than 5 hours between eating.  If you eat breakfast, please do so within one hour of getting up.   Each meal should contain half fruits/vegetables, one quarter protein, and one quarter carbs (no bigger than a computer mouse)  Cut down on sweet beverages. This includes juice, soda, and sweet tea.   Drink at least 1 glass of water with each meal and aim for at least 8 glasses per day  Exercise at least 150 minutes every week.

## 2021-04-10 NOTE — Assessment & Plan Note (Signed)
On Cialis 10 mg daily as needed.  No side effects though does note medications seem to be more effective the day after he takes it.  We discussed potentially switching to Viagra however deferred for today.  He will let me know if he would like to make a switch in the future.

## 2021-04-11 ENCOUNTER — Encounter: Payer: Self-pay | Admitting: Family Medicine

## 2021-05-08 ENCOUNTER — Other Ambulatory Visit: Payer: Self-pay

## 2021-05-08 ENCOUNTER — Encounter: Payer: Self-pay | Admitting: Family Medicine

## 2021-05-08 MED ORDER — LOSARTAN POTASSIUM 25 MG PO TABS: 25.0000 mg | ORAL_TABLET | Freq: Every day | ORAL | 0 refills | Status: DC

## 2021-05-08 MED ORDER — AMLODIPINE BESYLATE 10 MG PO TABS: 10.0000 mg | ORAL_TABLET | Freq: Every day | ORAL | 0 refills | Status: DC

## 2021-06-04 ENCOUNTER — Other Ambulatory Visit: Payer: Self-pay | Admitting: Family Medicine

## 2021-06-07 ENCOUNTER — Ambulatory Visit (INDEPENDENT_AMBULATORY_CARE_PROVIDER_SITE_OTHER): Payer: Self-pay | Admitting: Family Medicine

## 2021-06-07 ENCOUNTER — Encounter: Payer: Self-pay | Admitting: Family Medicine

## 2021-06-07 ENCOUNTER — Other Ambulatory Visit: Payer: Self-pay

## 2021-06-07 ENCOUNTER — Telehealth: Payer: Self-pay | Admitting: Family Medicine

## 2021-06-07 VITALS — BP 142/76 | HR 91 | Temp 97.6°F | Ht 70.0 in | Wt 181.2 lb

## 2021-06-07 DIAGNOSIS — R6882 Decreased libido: Secondary | ICD-10-CM

## 2021-06-07 DIAGNOSIS — I10 Essential (primary) hypertension: Secondary | ICD-10-CM

## 2021-06-07 DIAGNOSIS — F419 Anxiety disorder, unspecified: Secondary | ICD-10-CM

## 2021-06-07 DIAGNOSIS — N529 Male erectile dysfunction, unspecified: Secondary | ICD-10-CM

## 2021-06-07 DIAGNOSIS — F17200 Nicotine dependence, unspecified, uncomplicated: Secondary | ICD-10-CM

## 2021-06-07 DIAGNOSIS — R0789 Other chest pain: Secondary | ICD-10-CM

## 2021-06-07 LAB — COMPREHENSIVE METABOLIC PANEL
ALT: 61 U/L — ABNORMAL HIGH (ref 0–53)
AST: 30 U/L (ref 0–37)
Albumin: 5.3 g/dL — ABNORMAL HIGH (ref 3.5–5.2)
Alkaline Phosphatase: 65 U/L (ref 39–117)
BUN: 14 mg/dL (ref 6–23)
CO2: 29 mEq/L (ref 19–32)
Calcium: 10.4 mg/dL (ref 8.4–10.5)
Chloride: 103 mEq/L (ref 96–112)
Creatinine, Ser: 0.86 mg/dL (ref 0.40–1.50)
GFR: 107.44 mL/min (ref >60.00)
Glucose, Bld: 99 mg/dL (ref 70–99)
Potassium: 6.5 mEq/L (ref 3.5–5.1)
Sodium: 142 mEq/L (ref 135–145)
Total Bilirubin: 0.8 mg/dL (ref 0.2–1.2)
Total Protein: 7.7 g/dL (ref 6.0–8.3)

## 2021-06-07 LAB — CBC
HCT: 53.5 % — ABNORMAL HIGH (ref 39.0–52.0)
Hemoglobin: 17.9 g/dL — ABNORMAL HIGH (ref 13.0–17.0)
MCHC: 33.5 g/dL (ref 30.0–36.0)
MCV: 89.9 fl (ref 78.0–100.0)
Platelets: 313 10*3/uL (ref 150.0–400.0)
RBC: 5.94 Mil/uL — ABNORMAL HIGH (ref 4.22–5.81)
RDW: 15 % (ref 11.5–15.5)
WBC: 5.1 10*3/uL (ref 4.0–10.5)

## 2021-06-07 LAB — TESTOSTERONE: Testosterone: 335.03 ng/dL (ref 300.00–890.00)

## 2021-06-07 LAB — TSH: TSH: 1.33 u[IU]/mL (ref 0.35–5.50)

## 2021-06-07 LAB — HEMOGLOBIN A1C: Hgb A1c MFr Bld: 5.4 % (ref 4.6–6.5)

## 2021-06-07 MED ORDER — SERTRALINE HCL 100 MG PO TABS: 50.0000 mg | ORAL_TABLET | Freq: Every day | ORAL | 3 refills | Status: DC

## 2021-06-07 MED ORDER — LOSARTAN POTASSIUM 50 MG PO TABS: 50.0000 mg | ORAL_TABLET | Freq: Every day | ORAL | 3 refills | Status: DC

## 2021-06-07 MED ORDER — ALPRAZOLAM 0.5 MG PO TABS: 0.5000 mg | ORAL_TABLET | Freq: Every evening | ORAL | 0 refills | Status: DC | PRN

## 2021-06-07 MED ORDER — TRAZODONE HCL 50 MG PO TABS: 50.0000 mg | ORAL_TABLET | Freq: Every day | ORAL | 5 refills | Status: DC

## 2021-06-07 NOTE — Patient Instructions (Signed)
It was very nice to see you today!  Please increase your losartan to 50 mg daily.  We will start trazodone 50 mg daily.  Hopefully this will help with libido and anxiety levels.  I will send in Xanax for you to use as needed.  Hopefully you will not need to use this daily.  We will check blood work today.  Please send a message in a few weeks via MyChart to let me know how you are doing.  Take care, Dr [name]  PLEASE NOTE:  If you had any lab tests please let us know if you have not heard back within a few days. You may see your results on mychart before we have a chance to review them but we will give you a call once they are reviewed by [place]. If we ordered any referrals today, please let us know if you have not heard from their office within the next week.   Please try these tips to maintain a healthy lifestyle:  Eat at least 3 REAL meals and 1-2 snacks per day.  Aim for no more than 5 hours between eating.  If you eat breakfast, please do so within one hour of getting up.   Each meal should contain half fruits/vegetables, one quarter protein, and one quarter carbs (no bigger than a computer mouse)  Cut down on sweet beverages. This includes juice, soda, and sweet tea.   Drink at least 1 glass of water with each meal and aim for at least 8 glasses per day  Exercise at least 150 minutes every week.    Preventive Care 91-30 Years Old, Male Preventive care refers to lifestyle choices and visits with your health care provider that can promote health and wellness. Preventive care visits are also called wellness exams. What can I expect for my preventive care visit? Counseling During your preventive care visit, your health care provider may ask about your: Medical history, including: Past medical problems. Family medical history. Current health, including: Emotional well-being. Home life and relationship well-being. Sexual activity. Lifestyle, including: Alcohol, nicotine or  tobacco, and drug use. Access to firearms. Diet, exercise, and sleep habits. Safety issues such as seatbelt and bike helmet use. Sunscreen use. Work and work Astronomer. Physical exam Your health care provider will check your: Height and weight. These may be used to calculate your BMI (body mass index). BMI is a measurement that tells if you are at a healthy weight. Waist circumference. This measures the distance around your waistline. This measurement also tells if you are at a healthy weight and may help predict your risk of certain diseases, such as type 2 diabetes and high blood pressure. Heart rate and blood pressure. Body temperature. Skin for abnormal spots. What immunizations do I need? Vaccines are usually given at various ages, according to a schedule. Your health care provider will recommend vaccines for you based on your age, medical history, and lifestyle or other factors, such as travel or where you work. What tests do I need? Screening Your health care provider may recommend screening tests for certain conditions. This may include: Lipid and cholesterol levels. Diabetes screening. This is done by checking your blood sugar (glucose) after you have not eaten for a while (fasting). Hepatitis B test. Hepatitis C test. HIV (human immunodeficiency virus) test. STI (sexually transmitted infection) testing, if you are at risk. Lung cancer screening. Prostate cancer screening. Colorectal cancer screening. Talk with your health care provider about your test results, treatment options, and  if necessary, the need for more tests. Follow these instructions at home: Eating and drinking  Eat a diet that includes fresh fruits and vegetables, whole grains, lean protein, and low-fat dairy products. Take vitamin and mineral supplements as recommended by your health care provider. Do not drink alcohol if your health care provider tells you not to drink. If you drink alcohol: Limit how  much you have to 0-2 drinks a day. Know how much alcohol is in your drink. In the [place] one drink equals one 12 oz bottle of beer (355 mL), one 5 oz glass of wine (148 mL), or one 1 oz glass of hard liquor (44 mL). Lifestyle Brush your teeth every morning and night with fluoride toothpaste. Floss one time each day. Exercise for at least 30 minutes 5 or more days each week. Do not use any products that contain nicotine or tobacco. These products include cigarettes, chewing tobacco, and vaping devices, such as e-cigarettes. If you need help quitting, ask your health care provider. Do not use drugs. If you are sexually active, practice safe sex. Use a condom or other form of protection to prevent STIs. Take aspirin only as told by your health care provider. Make sure that you understand how much to take and what form to take. Work with your health care provider to find out whether it is safe and beneficial for you to take aspirin daily. Find healthy ways to manage stress, such as: Meditation, yoga, or listening to music. Journaling. Talking to a trusted person. Spending time with friends and family. Minimize exposure to UV radiation to reduce your risk of skin cancer. Safety Always wear your seat belt while driving or riding in a vehicle. Do not drive: If you have been drinking alcohol. Do not ride with someone who has been drinking. When you are tired or distracted. While texting. If you have been using any mind-altering substances or drugs. Wear a helmet and other protective equipment during sports activities. If you have firearms in your house, make sure you follow all gun safety procedures. What's next? Go to your health care provider once a year for an annual wellness visit. Ask your health care provider how often you should have your eyes and teeth checked. Stay up to date on all vaccines. This information is not intended to replace advice given to you by your health care provider.  Make sure you discuss any questions you have with your health care provider. Document Revised: 10/25/2020 Document Reviewed: 10/25/2020 Elsevier Patient Education  2022 [employer].

## 2021-06-07 NOTE — Assessment & Plan Note (Signed)
Likely medication side effect.  We will check testosterone today.  Hopefully should have some improvement with trazodone.

## 2021-06-07 NOTE — Assessment & Plan Note (Signed)
Slightly above goal.  We will increase dose of losartan from 25 to 50 mg daily.  Continue amlodipine 10 mg daily.  Check labs.

## 2021-06-07 NOTE — Telephone Encounter (Signed)
Critical Lab:  High Potassium Level:6.5 Time: 3:45  Took the call from [facility] from the [facility]

## 2021-06-07 NOTE — Progress Notes (Signed)
Can we please have him come back for repeat BMET?  His potassium was very high.  I believe this is probably a false reading.  Please place future order for BMET.   All of his other labs are stable.

## 2021-06-07 NOTE — Assessment & Plan Note (Signed)
On Cialis 10 mg daily as needed.  Doing well.

## 2021-06-07 NOTE — Assessment & Plan Note (Signed)
Had lengthy discussion with patient regarding treatment for his anxiety.  He feels like he is doing better with Zoloft 50 mg daily with less side effects.  He has done well with alprazolam in the past.  He request to have something to use as needed.  We will restart low-dose.  Discussed with patient this should not be a medication that he uses daily.  He voiced understanding.  We will also start low-dose trazodone 50 mg daily.  Believe this should help some with his low libido as well.  He will send me a message in a few weeks via MyChart to let me know how this is working.  We can increase the dose of trazodone as needed.

## 2021-06-07 NOTE — Telephone Encounter (Signed)
Please see result note. He needs to come back to stat BMET. I think this is probably a false elevation but we need to verify.  Katina Degree. [name], MD 06/07/2021 4:03 PM

## 2021-06-07 NOTE — Progress Notes (Signed)
Chief Complaint:  Brent Washington is a 41 y.o. male who presents today for his annual comprehensive physical exam.    Assessment/Plan:  New/Acute Problems: Chest Pain No current symptoms.  EKG stable compared to previous readings.  Potentially anxiety related.  No red flag signs or symptoms.  History is atypical for cardiac etiology.  PERC score 0.  We will continue with watchful waiting.  Hopefully should improve with treatment for anxiety.  We discussed reasons to return to care.  Chronic Problems Addressed Today: Erectile dysfunction On Cialis 10 mg daily as needed.  Doing well.  Anxiety Had lengthy discussion with patient regarding treatment for his anxiety.  He feels like he is doing better with Zoloft 50 mg daily with less side effects.  He has done well with alprazolam in the past.  He request to have something to use as needed.  We will restart low-dose.  Discussed with patient this should not be a medication that he uses daily.  He voiced understanding.  We will also start low-dose trazodone 50 mg daily.  Believe this should help some with his low libido as well.  He will send me a message in a few weeks via MyChart to let me know how this is working.  We can increase the dose of trazodone as needed.  Essential hypertension Slightly above goal.  We will increase dose of losartan from 25 to 50 mg daily.  Continue amlodipine 10 mg daily.  Check labs.  Low libido Likely medication side effect.  We will check testosterone today.  Hopefully should have some improvement with trazodone.   Preventative Healthcare: Would like to get blood work done today.  Patient Counseling(The following topics were reviewed and/or handout was given):  -Nutrition: Stressed importance of moderation in sodium/caffeine intake, saturated fat and cholesterol, caloric balance, sufficient intake of fresh fruits, vegetables, and fiber.  -Stressed the importance of regular exercise.   -Substance Abuse:  Discussed cessation/primary prevention of tobacco, alcohol, or other drug use; driving or other dangerous activities under the influence; availability of treatment for abuse.   -Injury prevention: Discussed safety belts, safety helmets, smoke detector, smoking near bedding or upholstery.   -Sexuality: Discussed sexually transmitted diseases, partner selection, use of condoms, avoidance of unintended pregnancy and contraceptive alternatives.   -Dental health: Discussed importance of regular tooth brushing, flossing, and dental visits.  -Health maintenance and immunizations reviewed. Please refer to Health maintenance section.  Return to care in 1 year for next preventative visit.     Subjective:  HPI:  He has no acute complaints today.   He has had issue with anxiety. He has tried several medications in the past but this has not helped. He was started on Zoloft 100 mg daily. He notes he is unable to tolerate higher dose of Zoloft due to sexual side effects. He is currently taking Zoloft 50 mg daily and this seems to help. Symptoms have been the same. He has been on Xanax in the past which worked well for him. He tried Wellbutrin in the past but had similar side effects. He is sleeping well but sometime wake up in the middle of night. Had panic attacks in the past. Denies SI or HI.   He has had a history of elevated blood pressure in the past several years. His blood pressure was elevated in the office today. He is on Amlodipine 10 mg daily and Losartan 25 mg. He has done well without any side effects. He does check  his blood at home and had some high. He has had some chest discomfort. Denies headaches, fever or shortness of breath.  He also complain of chest discomfort. This has been going on for the past few weeks. He had similar issue in the past. He went to ED for this and was found to have Pneumonia. He note symptoms feels the same. Symptoms come and go. He has not tried any treatment. He would  like to get EKG. He denies fever or shortness of breath. Non exertional. Occurs randomly.     Lifestyle Diet: None specific Exercise: None specific  [facility] screen [facility] 06/07/2021  Decreased Interest 2  Down, Depressed, Hopeless 2  PHQ - 2 Score 4  Altered sleeping 0  Tired, decreased energy 2  Change in appetite 2  Feeling bad or failure about yourself  0  Trouble concentrating 0  Moving slowly or fidgety/restless -  Suicidal thoughts 0  PHQ-9 Score 8  Difficult doing work/chores -    Health Maintenance Due  Topic Date Due   Hepatitis C Screening  Never done     ROS: Per HPI, otherwise a complete review of systems was negative.   PMH:  The following were reviewed and entered/updated in epic: Past Medical History:  Diagnosis Date   Alcohol abuse    Depression    Hypertension    Tobacco abuse    Patient Active Problem List   Diagnosis Date Noted   Low libido 06/07/2021   Tinea versicolor 09/29/2020   Pruritus ani 09/29/2020   Erectile dysfunction 09/29/2020   Essential hypertension 05/15/2018   Anxiety 05/15/2018   Nicotine dependence with current use 05/15/2018   Alcohol use 05/15/2018   No past surgical history on file.  Family History  Problem Relation Age of Onset   Hypertension Mother    Depression Mother    Early death Father    Heart disease Father    Hypertension Father    Heart disease Maternal Uncle     Medications- reviewed and updated Current Outpatient Medications  Medication Sig Dispense Refill   ALPRAZolam (XANAX) 0.5 MG tablet Take 1 tablet (0.5 mg total) by mouth at bedtime as needed for anxiety. 30 tablet 0   amLODipine (NORVASC) 10 MG tablet Take 1 tablet (10 mg total) by mouth daily. 90 tablet 0   aspirin EC 81 MG tablet Take by mouth.     diclofenac (VOLTAREN) 75 MG EC tablet Take 1 tablet (75 mg total) by mouth 2 (two) times daily. 30 tablet 0   ketoconazole (NIZORAL) 2 % shampoo APPLY 1 APPLICATION TOPICALLY DAILY FOR A  WEEK THEN AS NEEDED 120 mL 0   sertraline (ZOLOFT) 100 MG tablet Take 0.5 tablets (50 mg total) by mouth daily. 30 tablet 3   tadalafil (CIALIS) 10 MG tablet TAKE 1/2 TO 1 (ONE-HALF TO ONE) TABLET BY MOUTH ONCE DAILY AS NEEDED FOR ERECTILE DYSFUNCTION 10 tablet 0   tiZANidine (ZANAFLEX) 4 MG tablet Take 1 tablet (4 mg total) by mouth every 6 (six) hours as needed for muscle spasms. 30 tablet 0   traZODone (DESYREL) 50 MG tablet Take 1 tablet (50 mg total) by mouth daily. 30 tablet 5   losartan (COZAAR) 50 MG tablet Take 1 tablet (50 mg total) by mouth daily. 90 tablet 3   No current facility-administered medications for this visit.    Allergies-reviewed and updated Allergies  Allergen Reactions   Wellbutrin [Bupropion] Anxiety    Social History   Socioeconomic  History   Marital status: Single    Spouse name: Not on file   Number of children: Not on file   Years of education: Not on file   Highest education level: Not on file  Occupational History   Not on file  Tobacco Use   Smoking status: Every Day    Years: 20.00    Types: Cigarettes   Smokeless tobacco: Current    Types: Snuff, Chew  Vaping Use   Vaping Use: Never used  Substance and Sexual Activity   Alcohol use: Yes    Alcohol/week: 20.0 standard drinks    Types: 20 Shots of liquor per week   Drug use: Never   Sexual activity: Yes  Other Topics Concern   Not on file  Social History Narrative   Not on file   Social Determinants of Health   Financial Resource Strain: Not on file  Food Insecurity: Not on file  Transportation Needs: Not on file  Physical Activity: Not on file  Stress: Not on file  Social Connections: Not on file        Objective:  Physical Exam: BP (!) 142/76    Pulse 91    Temp 97.6 F (36.4 C)    Ht 5\' 10"  (1.778 m)    Wt 181 lb 3.2 oz (82.2 kg)    SpO2 97%    BMI 26.00 kg/m   Body mass index is 26 kg/m. Wt Readings from Last 3 Encounters:  06/07/21 181 lb 3.2 oz (82.2 kg)   04/10/21 175 lb 12.8 oz (79.7 kg)  09/29/20 191 lb (86.6 kg)   Gen: NAD, resting comfortably HEENT: TMs normal bilaterally. OP clear. No thyromegaly noted.  CV: RRR with no murmurs appreciated Pulm: NWOB, CTAB with no crackles, wheezes, or rhonchi GI: Normal bowel sounds present. Soft, Nontender, Nondistended. MSK: no edema, cyanosis, or clubbing noted Skin: warm, dry Neuro: CN2-12 grossly intact. Strength 5/5 in upper and lower extremities. Reflexes symmetric and intact bilaterally.  Psych: Normal affect and thought content  EKG: Normal sinus rhythm.  Similar in appearance to previous EKGs.  Slight ST elevation in V1 through V4 though this is concave up and similar appearance to previous EKGs.      I,Savera Zaman,acting as a 10/01/20 for Neurosurgeon, MD.,have documented all relevant documentation on the behalf of Brent Doe, MD,as directed by  Brent Doe, MD while in the presence of Brent Doe, MD.   I, Brent Doe, MD, have reviewed all documentation for this visit. The documentation on 06/07/21 for the exam, diagnosis, procedures, and orders are all accurate and complete.  Time Spent: 40 minutes of total time was spent on the date of the encounter performing the following actions: chart review prior to seeing the patient, obtaining history, performing a medically necessary exam, counseling on the treatment plan including medication changes, placing orders, and documenting in our EHR.    06/09/21. Katina Degree, MD 06/07/2021 11:32 AM

## 2021-06-08 ENCOUNTER — Other Ambulatory Visit: Payer: Self-pay

## 2021-06-08 ENCOUNTER — Encounter: Payer: Self-pay | Admitting: Family Medicine

## 2021-06-08 DIAGNOSIS — R899 Unspecified abnormal finding in specimens from other organs, systems and tissues: Secondary | ICD-10-CM

## 2021-06-09 NOTE — Telephone Encounter (Signed)
See note

## 2021-06-11 NOTE — Telephone Encounter (Signed)
I agree with holding off on dose changes until we get his repeat labs.  Katina Degree. [name], MD 06/11/2021 7:58 AM

## 2021-06-13 ENCOUNTER — Other Ambulatory Visit (INDEPENDENT_AMBULATORY_CARE_PROVIDER_SITE_OTHER): Payer: Self-pay

## 2021-06-13 ENCOUNTER — Other Ambulatory Visit: Payer: Self-pay

## 2021-06-13 DIAGNOSIS — R899 Unspecified abnormal finding in specimens from other organs, systems and tissues: Secondary | ICD-10-CM

## 2021-06-13 LAB — BASIC METABOLIC PANEL
BUN: 14 mg/dL (ref 6–23)
CO2: 28 mEq/L (ref 19–32)
Calcium: 9.7 mg/dL (ref 8.4–10.5)
Chloride: 104 mEq/L (ref 96–112)
Creatinine, Ser: 0.83 mg/dL (ref 0.40–1.50)
GFR: 108.59 mL/min (ref >60.00)
Glucose, Bld: 98 mg/dL (ref 70–99)
Potassium: 4.4 mEq/L (ref 3.5–5.1)
Sodium: 139 mEq/L (ref 135–145)

## 2021-06-14 NOTE — Progress Notes (Signed)
Please inform patient of the following:  His potassium is back to normal.  His last result was probably a lab error.  Do not need to do any further testing at this point.  He should increase his losartan to 50 mg daily as we discussed at his office visit if his blood pressures continue to be elevated.

## 2021-07-03 ENCOUNTER — Other Ambulatory Visit: Payer: Self-pay | Admitting: Family Medicine

## 2021-07-06 ENCOUNTER — Other Ambulatory Visit: Payer: Self-pay

## 2021-07-06 ENCOUNTER — Encounter: Payer: Self-pay | Admitting: Family Medicine

## 2021-07-06 MED ORDER — SERTRALINE HCL 100 MG PO TABS: 50.0000 mg | ORAL_TABLET | Freq: Every day | ORAL | 3 refills | Status: DC

## 2021-07-06 NOTE — Telephone Encounter (Signed)
Called pt Pharmacy and medication was never received that was previously sent. Verified pt hadn't picked up since 06/05/21 a month supply no refills. Sent in a new Rx to [facility] for patient to pick up.

## 2021-07-23 ENCOUNTER — Other Ambulatory Visit: Payer: Self-pay | Admitting: Family Medicine

## 2021-08-14 ENCOUNTER — Other Ambulatory Visit: Payer: Self-pay | Admitting: *Deleted

## 2021-08-14 MED ORDER — AMLODIPINE BESYLATE 10 MG PO TABS: 10.0000 mg | ORAL_TABLET | Freq: Every day | ORAL | 0 refills | Status: DC

## 2021-08-19 ENCOUNTER — Other Ambulatory Visit: Payer: Self-pay | Admitting: Family Medicine

## 2021-08-21 ENCOUNTER — Telehealth: Payer: Self-pay | Admitting: *Deleted

## 2021-08-21 NOTE — Telephone Encounter (Signed)
error 

## 2021-09-18 ENCOUNTER — Other Ambulatory Visit: Payer: Self-pay | Admitting: Family Medicine

## 2021-09-18 NOTE — Telephone Encounter (Signed)
Last visit: 06/07/21 ? ?Next visit: none ? ?Last filled: 07/24/21 ? ?Quantity: 30- Alprazolam ?     30- Sertraline  ?

## 2021-10-02 ENCOUNTER — Other Ambulatory Visit: Payer: Self-pay | Admitting: Family Medicine

## 2021-10-31 ENCOUNTER — Other Ambulatory Visit: Payer: Self-pay | Admitting: Family Medicine

## 2021-10-31 MED ORDER — SERTRALINE HCL 100 MG PO TABS: 100.0000 mg | ORAL_TABLET | Freq: Every day | ORAL | 0 refills | Status: DC

## 2021-10-31 MED ORDER — ALPRAZOLAM 0.5 MG PO TABS: 0.5000 mg | ORAL_TABLET | Freq: Every evening | ORAL | 5 refills | Status: DC | PRN

## 2021-11-07 IMAGING — DX DG CHEST 2V
2 series · 2 of 2 positions shown · non-contrast
Comparison: September 05, 2020

CLINICAL DATA: Pneumonia follow-up

EXAM:
CHEST - 2 VIEW

[chest pa]
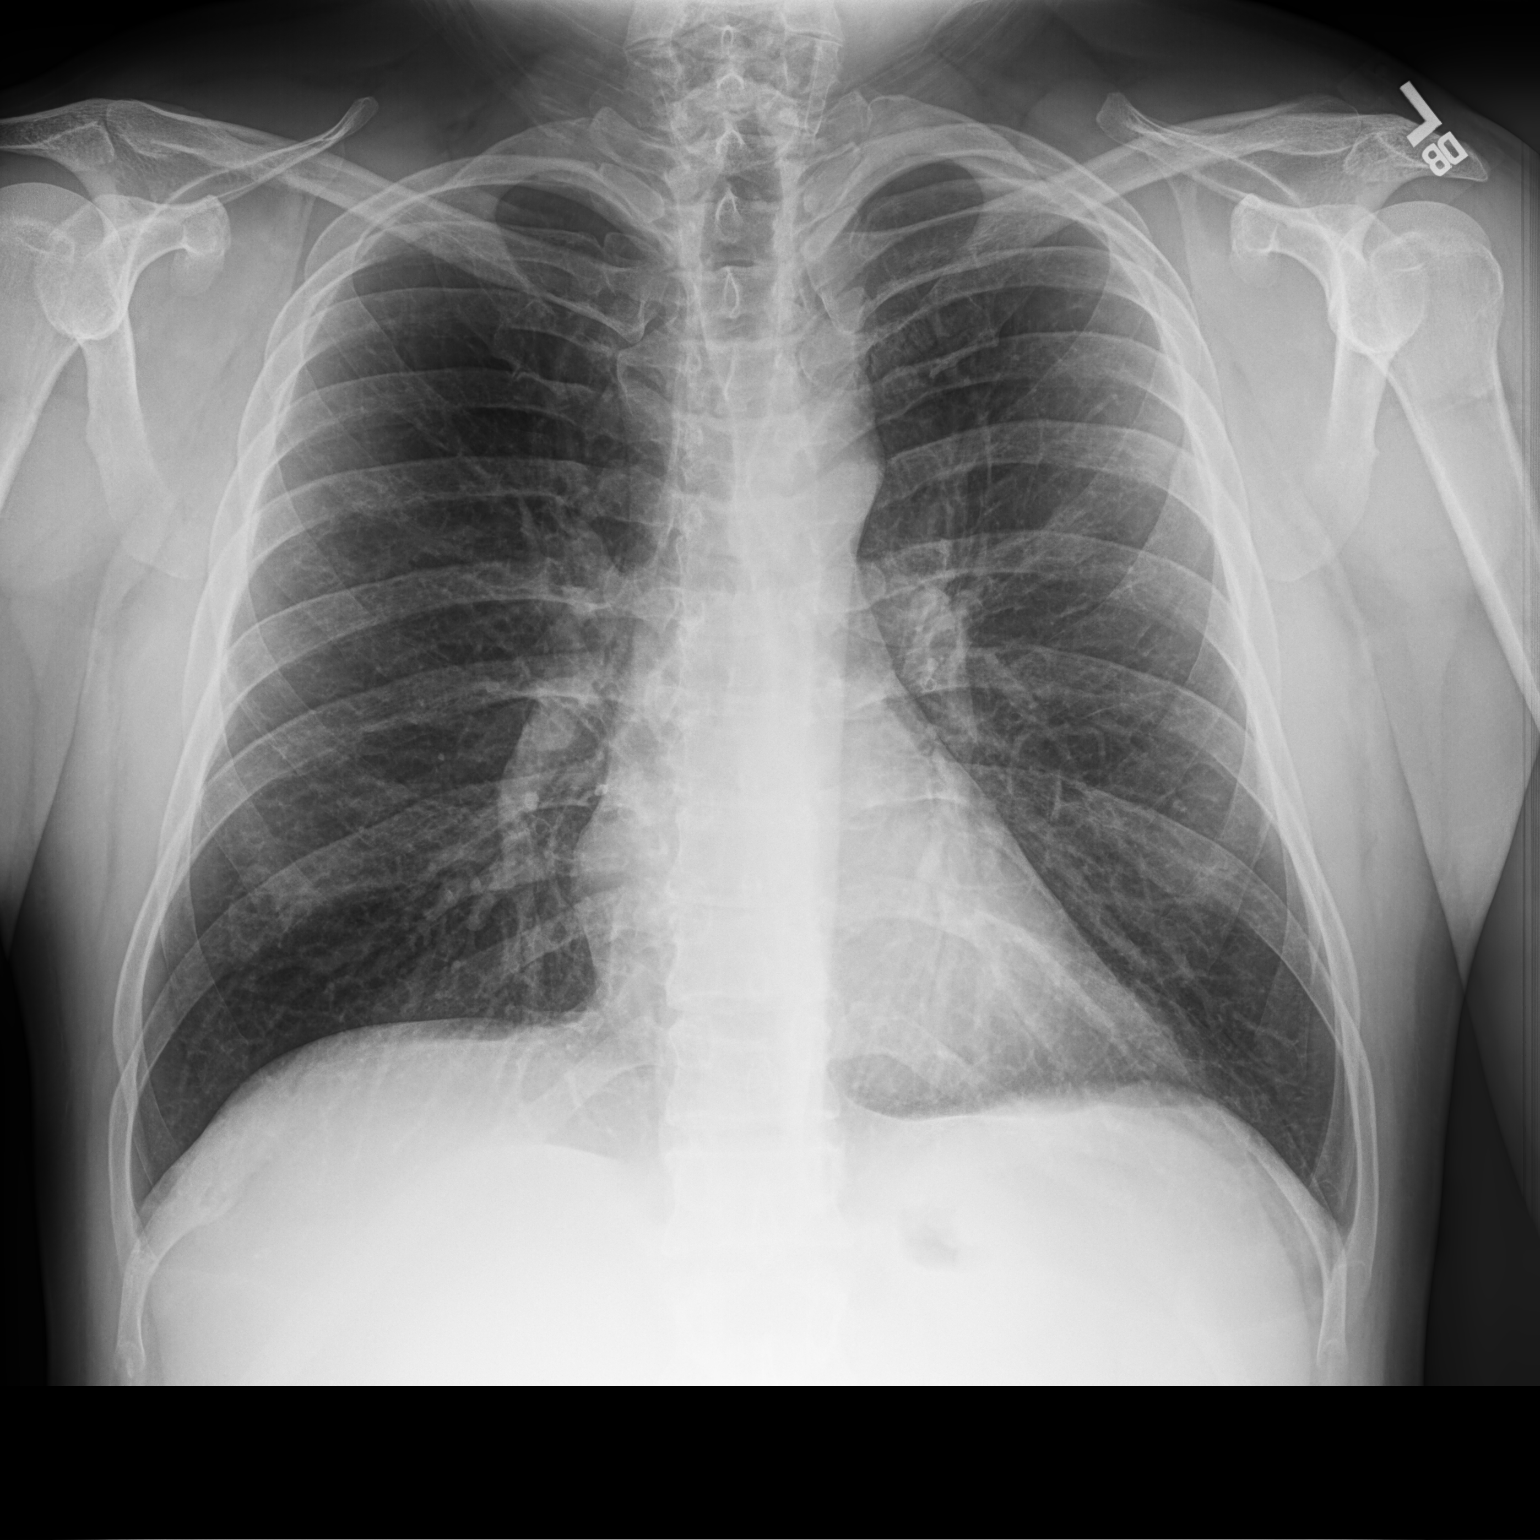

[chest lat]
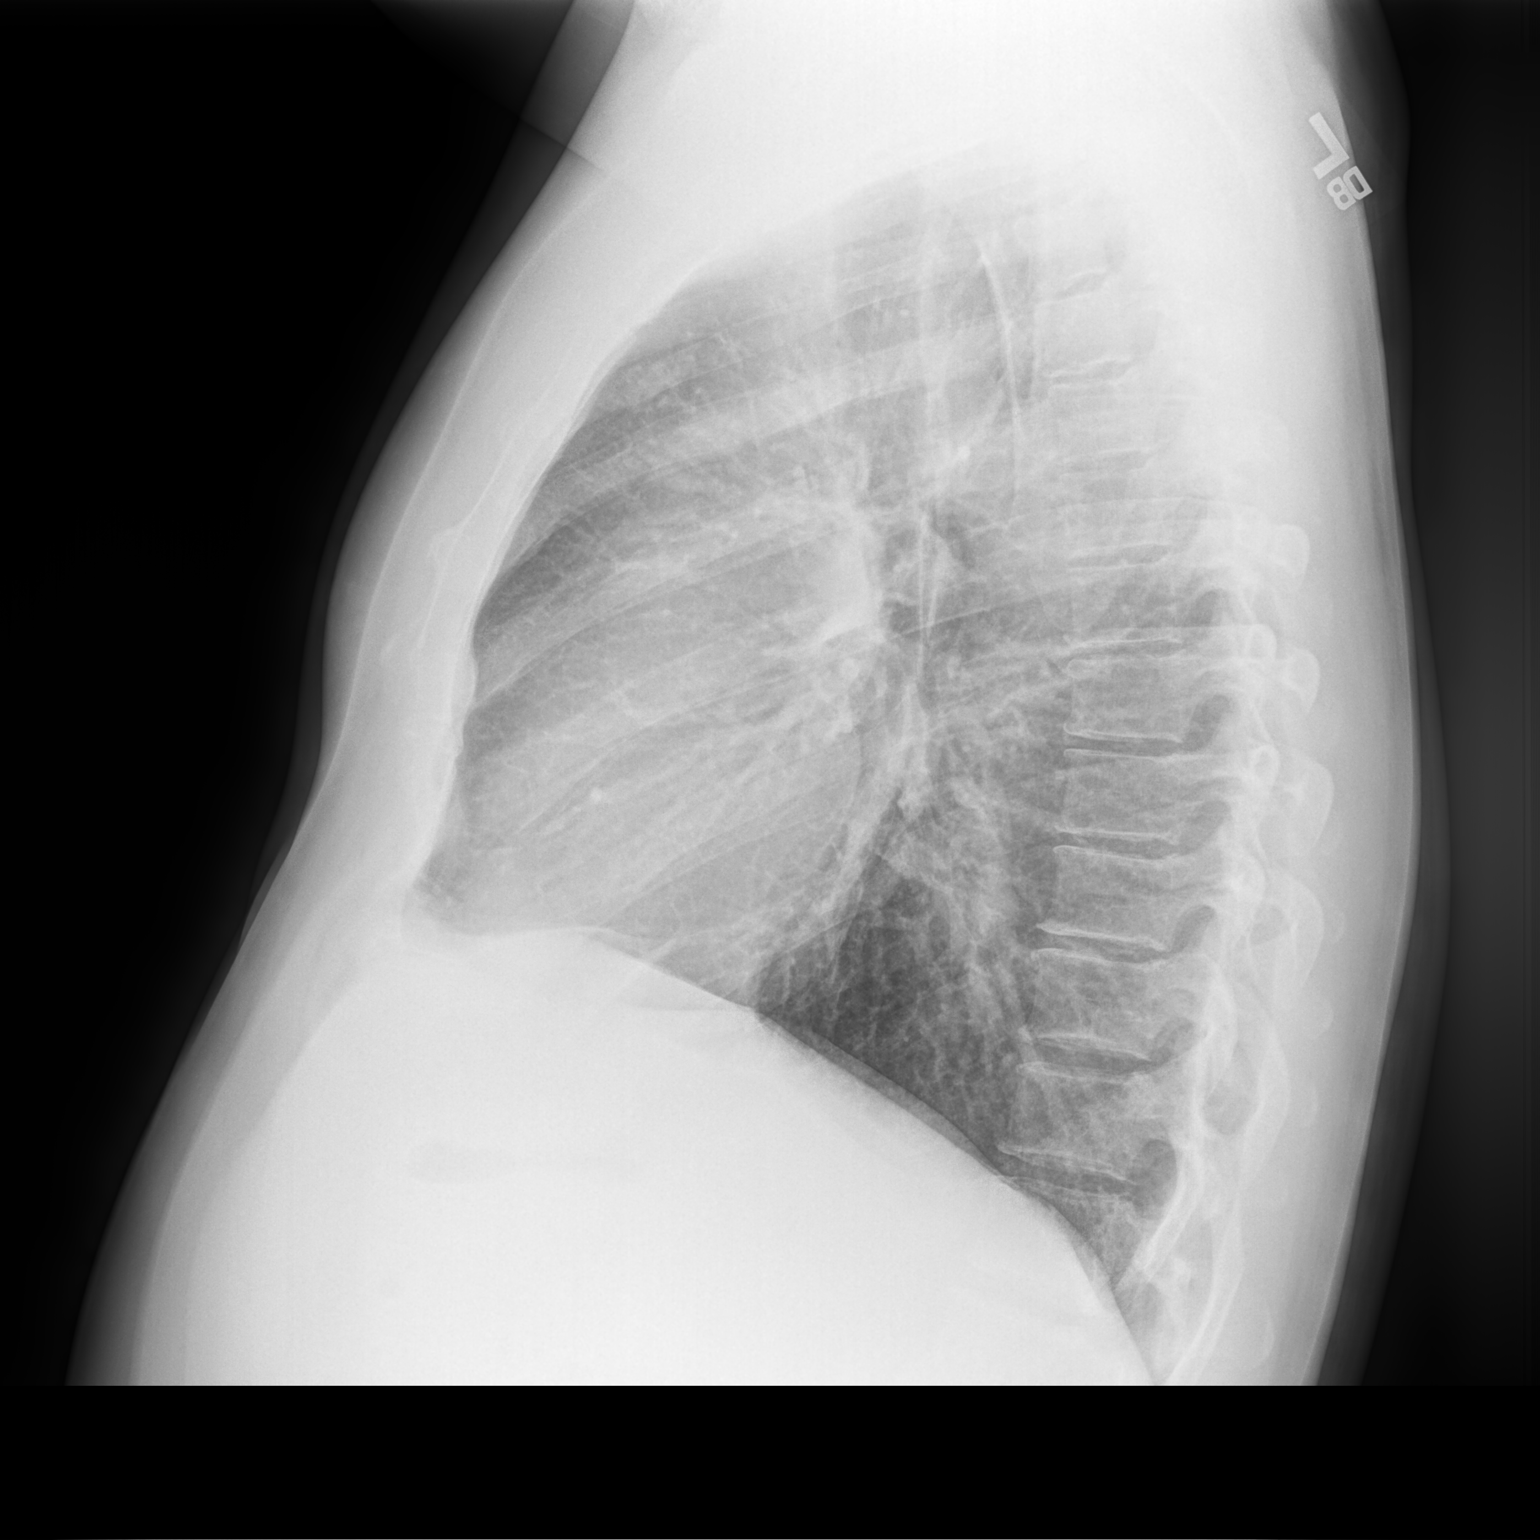

[2 of 2 positions shown; findings below may reference images not displayed]

FINDINGS: The heart size and mediastinal contours are within normal limits.
Both lungs are clear. The visualized skeletal structures are
unremarkable.
IMPRESSION: No active cardiopulmonary disease.

## 2021-11-11 ENCOUNTER — Other Ambulatory Visit: Payer: Self-pay | Admitting: Family Medicine

## 2021-11-12 MED ORDER — AMLODIPINE BESYLATE 10 MG PO TABS: 10.0000 mg | ORAL_TABLET | Freq: Every day | ORAL | 0 refills | Status: DC

## 2021-11-13 ENCOUNTER — Other Ambulatory Visit: Payer: Self-pay | Admitting: Family Medicine

## 2021-11-14 ENCOUNTER — Other Ambulatory Visit: Payer: Self-pay | Admitting: *Deleted

## 2021-11-14 MED ORDER — TADALAFIL 10 MG PO TABS: ORAL_TABLET | ORAL | 0 refills | Status: DC

## 2021-11-28 ENCOUNTER — Other Ambulatory Visit: Payer: Self-pay | Admitting: Family Medicine

## 2021-11-28 MED ORDER — SERTRALINE HCL 100 MG PO TABS: 100.0000 mg | ORAL_TABLET | Freq: Every day | ORAL | 0 refills | Status: DC

## 2021-11-29 ENCOUNTER — Other Ambulatory Visit: Payer: Self-pay | Admitting: Family Medicine

## 2021-12-16 ENCOUNTER — Other Ambulatory Visit: Payer: Self-pay | Admitting: Family Medicine

## 2021-12-17 ENCOUNTER — Encounter: Payer: Self-pay | Admitting: Family Medicine

## 2021-12-17 MED ORDER — LOSARTAN POTASSIUM 50 MG PO TABS: 50.0000 mg | ORAL_TABLET | Freq: Every day | ORAL | 3 refills | Status: DC

## 2021-12-17 MED ORDER — TRAZODONE HCL 50 MG PO TABS: 50.0000 mg | ORAL_TABLET | Freq: Every day | ORAL | 0 refills | Status: DC

## 2021-12-18 MED ORDER — ALPRAZOLAM 0.5 MG PO TABS: 0.5000 mg | ORAL_TABLET | Freq: Every evening | ORAL | 5 refills | Status: DC | PRN

## 2021-12-31 ENCOUNTER — Encounter: Payer: Self-pay | Admitting: Family Medicine

## 2021-12-31 ENCOUNTER — Other Ambulatory Visit: Payer: Self-pay | Admitting: Family Medicine

## 2022-01-19 ENCOUNTER — Other Ambulatory Visit: Payer: Self-pay | Admitting: Family Medicine

## 2022-01-25 ENCOUNTER — Ambulatory Visit (INDEPENDENT_AMBULATORY_CARE_PROVIDER_SITE_OTHER): Payer: Self-pay | Admitting: Family Medicine

## 2022-01-25 VITALS — BP 142/88 | HR 119 | Ht 70.0 in | Wt 194.0 lb

## 2022-01-25 DIAGNOSIS — I10 Essential (primary) hypertension: Secondary | ICD-10-CM

## 2022-01-25 DIAGNOSIS — R5383 Other fatigue: Secondary | ICD-10-CM

## 2022-01-25 DIAGNOSIS — F419 Anxiety disorder, unspecified: Secondary | ICD-10-CM

## 2022-01-25 LAB — VITAMIN D 25 HYDROXY (VIT D DEFICIENCY, FRACTURES): VITD: 37.48 ng/mL (ref 30.00–100.00)

## 2022-01-25 LAB — VITAMIN B12: Vitamin B-12: 282 pg/mL (ref 211–911)

## 2022-01-25 NOTE — Progress Notes (Signed)
   Brent Washington is a 41 y.o. male who presents today for an office visit.  Assessment/Plan:  New/Acute Problems: Other Fatigue Multifactorial though has several symptoms concerning for possible OSA.  We will have him set up for home sleep study to further evaluate.  Recently had labs done few months ago which including CBC, TSH, and testosterone which were normal.  We will check B12 and vitamin D as these have not been done recently.    Chronic Problems Addressed Today: Anxiety Symptoms are not very well controlled at this point.  Higher doses of Zoloft caused issues with erectile dysfunction and decreased libido.  He will continue with 50 mg daily for now.  He will also continue taking trazodone nightly to help with his insomnia.  He has been using Xanax 0.5 mg nighttime as needed for insomnia as well which works well.  We will be evaluating for sleep apnea as above.    Essential hypertension Slightly elevated today though concern for possible underlying OSA which could be increasing his blood pressure as well.  We will continue losartan 50 mg daily and amlodipine 10 mg daily.  He can continue to monitor at home will hopefully will have some improvement in blood pressure as we treat potential OSA as above.     Subjective:  HPI:  Patient here with concern for to fatigue.  This has been worsening significantly for the last few months.  He has been under more stress with work due to increased responsibilities.  He has also had some difficulty with sleeping.  His girlfriend notes that he snores significantly.  Spoke himself up few times at night with snoring as well.  He will sometimes wake up between 3 to 5 AM and have difficulty falling back asleep.  Does not feel refreshed upon waking up.  He will usually go home and take a nap in the middle of the day 2 out of every 3 days.  See A/P for status of chronic conditions.      Objective:  Physical Exam: BP (!) 142/88   Pulse (!) 119    Ht 5\' 10"  (1.778 m)   Wt 194 lb (88 kg)   SpO2 98%   BMI 27.84 kg/m   Gen: No acute distress, resting comfortably CV: Regular rate and rhythm with no murmurs appreciated Pulm: Normal work of breathing, clear to auscultation bilaterally with no crackles, wheezes, or rhonchi Neuro: Grossly normal, moves all extremities Psych: Normal affect and thought content      Mashonda Broski M. , MD 01/25/2022 1:19 PM

## 2022-01-25 NOTE — Patient Instructions (Signed)
It was very nice to see you today!  I think you probably have sleep apnea.  We will order a test to have this evaluated.  They should call you next week to get this set up.  We will check a vitamin B12 and vitamin D level to make sure these are okay.  We do not need to check any other blood work today.  Take care, Dr [name]  PLEASE NOTE:  If you had any lab tests please let us know if you have not heard back within a few days. You may see your results on mychart before we have a chance to review them but we will give you a call once they are reviewed by [place]. If we ordered any referrals today, please let us know if you have not heard from their office within the next week.   Please try these tips to maintain a healthy lifestyle:  Eat at least 3 REAL meals and 1-2 snacks per day.  Aim for no more than 5 hours between eating.  If you eat breakfast, please do so within one hour of getting up.   Each meal should contain half fruits/vegetables, one quarter protein, and one quarter carbs (no bigger than a computer mouse)  Cut down on sweet beverages. This includes juice, soda, and sweet tea.   Drink at least 1 glass of water with each meal and aim for at least 8 glasses per day  Exercise at least 150 minutes every week.

## 2022-01-25 NOTE — Assessment & Plan Note (Addendum)
Slightly elevated today though concern for possible underlying OSA which could be increasing his blood pressure as well.  We will continue losartan 50 mg daily and amlodipine 10 mg daily.  He can continue to monitor at home will hopefully will have some improvement in blood pressure as we treat potential OSA as above.

## 2022-01-25 NOTE — Assessment & Plan Note (Signed)
Symptoms are not very well controlled at this point.  Higher doses of Zoloft caused issues with erectile dysfunction and decreased libido.  He will continue with 50 mg daily for now.  He will also continue taking trazodone nightly to help with his insomnia.  He has been using Xanax 0.5 mg nighttime as needed for insomnia as well which works well.  We will be evaluating for sleep apnea as above.

## 2022-01-27 ENCOUNTER — Other Ambulatory Visit: Payer: Self-pay | Admitting: Family Medicine

## 2022-01-28 NOTE — Progress Notes (Signed)
Please inform patient of the following:  B12 and vitamin D levels are normal.  Do not think these are causing any fatigue issues.  We need to have him evaluated for sleep apnea as we discussed at his office visit.

## 2022-01-31 ENCOUNTER — Ambulatory Visit: Payer: Self-pay | Admitting: Family Medicine

## 2022-02-04 ENCOUNTER — Other Ambulatory Visit: Payer: Self-pay

## 2022-02-09 ENCOUNTER — Other Ambulatory Visit: Payer: Self-pay | Admitting: Family Medicine

## 2022-02-11 MED ORDER — AMLODIPINE BESYLATE 10 MG PO TABS: 10.0000 mg | ORAL_TABLET | Freq: Every day | ORAL | 0 refills | Status: DC

## 2022-02-27 ENCOUNTER — Encounter: Payer: Self-pay | Admitting: Family Medicine

## 2022-02-28 NOTE — Telephone Encounter (Signed)
See patient note

## 2022-03-04 ENCOUNTER — Other Ambulatory Visit: Payer: Self-pay | Admitting: Family Medicine

## 2022-03-04 ENCOUNTER — Encounter: Payer: Self-pay | Admitting: Family Medicine

## 2022-03-04 MED ORDER — TRAZODONE HCL 50 MG PO TABS: 50.0000 mg | ORAL_TABLET | Freq: Every day | ORAL | 0 refills | Status: DC

## 2022-03-04 NOTE — Telephone Encounter (Signed)
Send Information to [facility] referral Coordinator

## 2022-03-07 ENCOUNTER — Other Ambulatory Visit: Payer: Self-pay | Admitting: Family Medicine

## 2022-03-09 ENCOUNTER — Encounter: Payer: Self-pay | Admitting: Family Medicine

## 2022-03-11 NOTE — Telephone Encounter (Signed)
Yes that is ok.  Brent Washington. [name], MD 03/11/2022 12:17 PM

## 2022-03-20 NOTE — Telephone Encounter (Signed)
I called pt & scheduled his hst.  Nothing further needed.  Will route to triage to close MyChart message.

## 2022-03-21 NOTE — Telephone Encounter (Signed)
Pt has already been scheduled.  Routing back to triage for MyChart message to be closed.

## 2022-03-28 ENCOUNTER — Ambulatory Visit: Payer: Self-pay | Admitting: Pulmonary Disease

## 2022-03-28 DIAGNOSIS — G4733 Obstructive sleep apnea (adult) (pediatric): Secondary | ICD-10-CM

## 2022-03-28 DIAGNOSIS — R5383 Other fatigue: Secondary | ICD-10-CM

## 2022-03-29 DIAGNOSIS — G4733 Obstructive sleep apnea (adult) (pediatric): Secondary | ICD-10-CM

## 2022-03-31 ENCOUNTER — Other Ambulatory Visit: Payer: Self-pay | Admitting: Family Medicine

## 2022-04-01 MED ORDER — TRAZODONE HCL 50 MG PO TABS: 50.0000 mg | ORAL_TABLET | Freq: Every day | ORAL | 0 refills | Status: DC

## 2022-04-03 ENCOUNTER — Ambulatory Visit (INDEPENDENT_AMBULATORY_CARE_PROVIDER_SITE_OTHER): Payer: Self-pay | Admitting: Family

## 2022-04-03 ENCOUNTER — Encounter: Payer: Self-pay | Admitting: Family

## 2022-04-03 VITALS — BP 127/83 | HR 109 | Temp 98.6°F | Ht 70.0 in | Wt 196.2 lb

## 2022-04-03 DIAGNOSIS — J209 Acute bronchitis, unspecified: Secondary | ICD-10-CM

## 2022-04-03 DIAGNOSIS — B370 Candidal stomatitis: Secondary | ICD-10-CM

## 2022-04-03 MED ORDER — NYSTATIN 100000 UNIT/ML MT SUSP: 5.0000 mL | Freq: Three times a day (TID) | OROMUCOSAL | 0 refills | Status: AC | PRN

## 2022-04-03 MED ORDER — AZITHROMYCIN 250 MG PO TABS: ORAL_TABLET | ORAL | 0 refills | Status: AC

## 2022-04-03 NOTE — Progress Notes (Signed)
Patient ID: Robi Dewolfe Kakos, male    DOB: August 26, 1980, 41 y.o.   MRN: [id number]  Chief Complaint  Patient presents with   Cough    Pt c/o cough, fatigue and congestion for 3 weeks but has been getting worse . Dull pain in chest when he coughs. Has not tried any medications. Woke up with chills this morning.     HPI:      Cough for 3 weeks- also with sinus sx - productive with yellow-green mucus. Denies fever, but had chills this am, also reports nasal congestion, sore throat, but no nasal drainage. pt is a daily smoker, had PNA earlier this year. Pt has not taken any OTC meds for sx, but did start Omeprazole OTC qod for heartburn.  Assessment & Plan:  1. Acute bronchitis, unspecified organism sending Zpack, advised on use & SE, advised on increasing water intake daily, needs to stop smoking.  - azithromycin (ZITHROMAX) 250 MG tablet; Take 2 tablets on day 1, then 1 tablet daily on days 2 through 5  Dispense: 6 tablet; Refill: 0  2. Thrush, oral - pt not aware, noted on exam, c/o sore throat, sending mouthwash, advised on use & SE - unsure of cause of thrush, advised to f/u with PCP if not resolving.  - magic mouthwash (nystatin, lidocaine, diphenhydrAMINE, alum & mag hydroxide) suspension; Swish and spit 5-10 mLs 3 (three) times daily as needed for up to 7 days for mouth pain. OK to swallow 73ml if having throat pain also.  Dispense: 180 mL; Refill: 0    Subjective:    Outpatient Medications Prior to Visit  Medication Sig Dispense Refill   ALPRAZolam (XANAX) 0.5 MG tablet Take 1 tablet (0.5 mg total) by mouth at bedtime as needed for anxiety. 30 tablet 5   amLODipine (NORVASC) 10 MG tablet Take 1 tablet (10 mg total) by mouth daily. 90 tablet 0   aspirin EC 81 MG tablet Take by mouth.     diclofenac (VOLTAREN) 75 MG EC tablet Take 1 tablet (75 mg total) by mouth 2 (two) times daily. 30 tablet 0   ketoconazole (NIZORAL) 2 % shampoo APPLY 1 APPLICATION TOPICALLY DAILY FOR A WEEK  THEN AS NEEDED 120 mL 0   omeprazole (PRILOSEC OTC) 20 MG tablet Take 20 mg by mouth daily.     sertraline (ZOLOFT) 100 MG tablet Take 1 tablet by mouth once daily 30 tablet 0   tadalafil (CIALIS) 10 MG tablet TAKE 1/2 TO 1 TABLET BY MOUTH ONCE DAILY AS NEEDED FOR ERECTILE DYSFUNCTION 10 tablet 0   tiZANidine (ZANAFLEX) 4 MG tablet Take 1 tablet (4 mg total) by mouth every 6 (six) hours as needed for muscle spasms. 30 tablet 0   traZODone (DESYREL) 50 MG tablet Take 1 tablet (50 mg total) by mouth daily. 30 tablet 0   losartan (COZAAR) 50 MG tablet Take 1 tablet (50 mg total) by mouth daily. 90 tablet 3   No facility-administered medications prior to visit.   Past Medical History:  Diagnosis Date   Alcohol abuse    Depression    Hypertension    Tobacco abuse    No past surgical history on file. Allergies  Allergen Reactions   Wellbutrin [Bupropion] Anxiety      Objective:    Physical Exam Vitals and nursing note reviewed.  Constitutional:      General: He is not in acute distress.    Appearance: Normal appearance.  HENT:     Head:  Normocephalic.     Mouth/Throat:     Mouth: Mucous membranes are moist.     Tongue: Lesions (thick whitish-green plaque coating on tongue surface) present.     Pharynx: Posterior oropharyngeal erythema present.  Cardiovascular:     Rate and Rhythm: Normal rate and regular rhythm.  Pulmonary:     Effort: Pulmonary effort is normal.     Breath sounds: Normal breath sounds.  Musculoskeletal:        General: Normal range of motion.     Cervical back: Normal range of motion.  Skin:    General: Skin is warm and dry.  Neurological:     Mental Status: He is alert and oriented to person, place, and time.  Psychiatric:        Mood and Affect: Mood normal.    BP 127/83 (BP Location: Left Arm, Patient Position: Sitting, Cuff Size: Large)   Pulse (!) 109   Temp 98.6 F (37 C) (Temporal)   Ht 5\' 10"  (1.778 m)   Wt 196 lb 3.2 oz (89 kg)   SpO2 99%    BMI 28.15 kg/m  Wt Readings from Last 3 Encounters:  04/03/22 196 lb 3.2 oz (89 kg)  01/25/22 194 lb (88 kg)  06/07/21 181 lb 3.2 oz (82.2 kg)       Jeanie Sewer, NP

## 2022-04-09 ENCOUNTER — Other Ambulatory Visit: Payer: Self-pay | Admitting: Family Medicine

## 2022-04-11 ENCOUNTER — Encounter: Payer: Self-pay | Admitting: Family Medicine

## 2022-04-11 NOTE — Progress Notes (Signed)
Please inform patient of the following:  Sleep study confirms mild sleep apnea.  This is probably causing some of his fatigue issues.  Recommend referral to pulmonology to discuss further management.

## 2022-04-12 ENCOUNTER — Other Ambulatory Visit: Payer: Self-pay | Admitting: *Deleted

## 2022-04-12 DIAGNOSIS — G4730 Sleep apnea, unspecified: Secondary | ICD-10-CM

## 2022-04-12 NOTE — Telephone Encounter (Signed)
See results note,LVM to patient to return call

## 2022-04-16 ENCOUNTER — Ambulatory Visit: Payer: Self-pay | Admitting: Family Medicine

## 2022-04-18 ENCOUNTER — Ambulatory Visit (INDEPENDENT_AMBULATORY_CARE_PROVIDER_SITE_OTHER): Payer: Self-pay | Admitting: Adult Health

## 2022-04-18 ENCOUNTER — Encounter: Payer: Self-pay | Admitting: Adult Health

## 2022-04-18 VITALS — BP 140/92 | HR 100 | Ht 70.0 in | Wt 194.8 lb

## 2022-04-18 DIAGNOSIS — F17200 Nicotine dependence, unspecified, uncomplicated: Secondary | ICD-10-CM

## 2022-04-18 DIAGNOSIS — G4733 Obstructive sleep apnea (adult) (pediatric): Secondary | ICD-10-CM

## 2022-04-18 NOTE — Assessment & Plan Note (Signed)
   Discussed smoking cessation. 

## 2022-04-18 NOTE — Patient Instructions (Signed)
Begin CPAP At bedtime  , wear all night long, for at least 6hr or more  Work on healthy weight loss  Do not drive if sleepy  Work on not smoking  Follow up in 3 months and As needed

## 2022-04-18 NOTE — Progress Notes (Signed)
@Patient  ID: Lemay, male    DOB: [birth year], 41 y.o.   MRN: [id number]  Chief Complaint  Patient presents with   Consult    Referring provider: [id number], MD  HPI: 41 year old male seen for sleep consult April 18, 2022 to establish for sleep apnea  TEST/EVENTS :  Home sleep study in March 28, 2022 showed mild sleep apnea with AHI at 13.4/hour and SpO2 low at 78%  04/18/2022 Sleep consult  Patient presents for a sleep consult today kindly referred by primary care provider Dr. 14/11/2021.  Patient complains of daytime sleepiness, restless sleep very loud snoring, gasping for air during his sleep.  Patient was set up for home sleep study by primary care provider.  Home sleep study done on March 28, 2022 showed mild obstructive sleep apnea with AHI at 13.4/hour and SpO2 low at 78%.  We discussed sleep study results in detail.  Patient has significant symptom burden as above.  We went over treatment options including weight loss, oral appliance and CPAP.  He would like to proceed with CPAP therapy.  Typically goes to bed at about 10 PM at night.  Is up multiple times throughout the night.  Gets up at 7:30 AM.  Weight has been steady over the last couple years.  Current weight is at 194 pounds with a BMI of 27.  He has no history of congestive heart failure or stroke.  Drinks on average about 4 cups of caffeine daily.  Occasionally takes a half a Xanax at nighttime for anxiety and insomnia.  Does not have any removable dental work.  No symptoms suspicious for cataplexy or sleep paralysis.  Epworth score is 8 out of 24.  Typically gets sleepy when he sits down to watch TV or read and in the afternoon hours.  Social history patient is single.  Lives with his girlfriend.  Has no children.  Patient works as a March 30, 2022.  He is self-pay.  He smokes about 2 packs of cigarettes daily.  Drinks about 8 alcoholic drinks daily including beer and whiskey.  And does smoke marijuana 2 times  a week.  Family history positive for emphysema, heart disease, hypertension   Surgical history none    Allergies  Allergen Reactions   Wellbutrin [Bupropion] Anxiety     There is no immunization history on file for this patient.  Past Medical History:  Diagnosis Date   Alcohol abuse    Depression    Hypertension    Tobacco abuse     Tobacco History: Social History   Tobacco Use  Smoking Status Every Day   Years: 20.00   Types: Cigarettes  Smokeless Tobacco Current   Types: Snuff, Chew  Tobacco Comments   1.5 ppd 04/18/22 BR   Ready to quit: Not Answered Counseling given: Not Answered Tobacco comments: 1.5 ppd 04/18/22 BR   Outpatient Medications Prior to Visit  Medication Sig Dispense Refill   ALPRAZolam (XANAX) 0.5 MG tablet Take 1 tablet (0.5 mg total) by mouth at bedtime as needed for anxiety. 30 tablet 5   amLODipine (NORVASC) 10 MG tablet Take 1 tablet (10 mg total) by mouth daily. 90 tablet 0   aspirin EC 81 MG tablet Take by mouth.     omeprazole (PRILOSEC OTC) 20 MG tablet Take 20 mg by mouth daily.     sertraline (ZOLOFT) 100 MG tablet Take 1 tablet by mouth once daily 30 tablet 0   tadalafil (CIALIS) 10 MG tablet TAKE 1/2  TO 1 TABLET BY MOUTH ONCE DAILY AS NEEDED FOR ERECTILE DYSFUNCTION 10 tablet 0   traZODone (DESYREL) 50 MG tablet Take 1 tablet (50 mg total) by mouth daily. 30 tablet 0   diclofenac (VOLTAREN) 75 MG EC tablet Take 1 tablet (75 mg total) by mouth 2 (two) times daily. (Patient not taking: Reported on 04/18/2022) 30 tablet 0   ketoconazole (NIZORAL) 2 % shampoo APPLY 1 APPLICATION TOPICALLY DAILY FOR A WEEK THEN AS NEEDED (Patient not taking: Reported on 04/18/2022) 120 mL 0   losartan (COZAAR) 50 MG tablet Take 1 tablet (50 mg total) by mouth daily. 90 tablet 3   tiZANidine (ZANAFLEX) 4 MG tablet Take 1 tablet (4 mg total) by mouth every 6 (six) hours as needed for muscle spasms. (Patient not taking: Reported on 04/18/2022) 30 tablet 0    No facility-administered medications prior to visit.     Review of Systems:   Constitutional:   No  weight loss, night sweats,  Fevers, chills, + fatigue, or  lassitude.  HEENT:   No headaches,  Difficulty swallowing,  Tooth/dental problems, or  Sore throat,                No sneezing, itching, ear ache, nasal congestion, post nasal drip,   CV:  No chest pain,  Orthopnea, PND, swelling in lower extremities, anasarca, dizziness, palpitations, syncope.   GI  No heartburn, indigestion, abdominal pain, nausea, vomiting, diarrhea, change in bowel habits, loss of appetite, bloody stools.   Resp: No shortness of breath with exertion or at rest.  No excess mucus, no productive cough,  No non-productive cough,  No coughing up of blood.  No change in color of mucus.  No wheezing.  No chest wall deformity  Skin: no rash or lesions.  GU: no dysuria, change in color of urine, no urgency or frequency.  No flank pain, no hematuria   MS:  No joint pain or swelling.  No decreased range of motion.  No back pain.    Physical Exam  BP (!) 140/92 Comment: rechecked BP  Pulse 100   Ht 5\' 10"  (1.778 m)   Wt 194 lb 12.8 oz (88.4 kg)   SpO2 98%   BMI 27.95 kg/m   GEN: A/Ox3; pleasant , NAD, well nourished    HEENT:  [place]/AT,   NOSE-clear, THROAT-clear, no lesions, no postnasal drip or exudate noted.  Class III MP airway  NECK:  Supple w/ fair ROM; no JVD; normal carotid impulses w/o bruits; no thyromegaly or nodules palpated; no lymphadenopathy.    RESP  Clear  P & A; w/o, wheezes/ rales/ or rhonchi. no accessory muscle use, no dullness to percussion  CARD:  RRR, no m/r/g, no peripheral edema, pulses intact, no cyanosis or clubbing.  GI:   Soft & nt; nml bowel sounds; no organomegaly or masses detected.   Musco: Warm bil, no deformities or joint swelling noted.   Neuro: alert, no focal deficits noted.    Skin: Warm, no lesions or rashes    Lab Results:  CBC   BMET   BNP No  results found for: "BNP"  ProBNP No results found for: "PROBNP"  Imaging: No results found.        No data to display          No results found for: "NITRICOXIDE"      Assessment & Plan:   OSA (obstructive sleep apnea) Mild obstructive sleep apnea with significant symptom burden.  Patient education  was given on sleep apnea and treatment options including weight loss oral appliance and CPAP therapy.  Patient would begin with CPAP therapy.  We discussed CPAP care. Will begin CPAP AutoSet 5 to 15 cm H2O.  Patient would like a smaller mask.  Does not want a full facemask.  We will try a DreamWear nasal mask.  He also has a full beard and mustache which will make it harder to use a fullface mask. Patient is private pay.  Advised to call back to our office if CPAP is not a financial option.  May need to [email or web address] if unable to afford at local DME company  - discussed how weight can impact sleep and risk for sleep disordered breathing - discussed options to assist with weight loss: combination of diet modification, cardiovascular and strength training exercises   - had an extensive discussion regarding the adverse health consequences related to untreated sleep disordered breathing - specifically discussed the risks for hypertension, coronary artery disease, cardiac dysrhythmias, cerebrovascular disease, and diabetes - lifestyle modification discussed   - discussed how sleep disruption can increase risk of accidents, particularly when driving - safe driving practices were discussed   Plan   Nicotine dependence with current use Discussed smoking cessation.     Rexene Edison, NP 04/18/2022

## 2022-04-18 NOTE — Assessment & Plan Note (Addendum)
Mild obstructive sleep apnea with significant symptom burden.  Patient education was given on sleep apnea and treatment options including weight loss oral appliance and CPAP therapy.  Patient would begin with CPAP therapy.  We discussed CPAP care. Will begin CPAP AutoSet 5 to 15 cm H2O.  Patient would like a smaller mask.  Does not want a full facemask.  We will try a DreamWear nasal mask.  He also has a full beard and mustache which will make it harder to use a fullface mask. Patient is private pay.  Advised to call back to our office if CPAP is not a financial option.  May need to [email or web address] if unable to afford at local DME company  - discussed how weight can impact sleep and risk for sleep disordered breathing - discussed options to assist with weight loss: combination of diet modification, cardiovascular and strength training exercises   - had an extensive discussion regarding the adverse health consequences related to untreated sleep disordered breathing - specifically discussed the risks for hypertension, coronary artery disease, cardiac dysrhythmias, cerebrovascular disease, and diabetes - lifestyle modification discussed   - discussed how sleep disruption can increase risk of accidents, particularly when driving - safe driving practices were discussed   Plan

## 2022-04-22 NOTE — Progress Notes (Signed)
Reviewed and agree with assessment/plan.   Amilia Vandenbrink, MD [facility] 04/22/2022, 7:03 AM Pager:  [phone]  

## 2022-05-05 ENCOUNTER — Other Ambulatory Visit: Payer: Self-pay | Admitting: Family Medicine

## 2022-06-06 ENCOUNTER — Other Ambulatory Visit: Payer: Self-pay | Admitting: Family Medicine

## 2022-07-09 ENCOUNTER — Other Ambulatory Visit: Payer: Self-pay | Admitting: Family Medicine

## 2022-07-18 ENCOUNTER — Encounter: Payer: Self-pay | Admitting: Adult Health

## 2022-07-18 ENCOUNTER — Telehealth: Payer: Self-pay | Admitting: *Deleted

## 2022-07-18 ENCOUNTER — Ambulatory Visit (INDEPENDENT_AMBULATORY_CARE_PROVIDER_SITE_OTHER): Payer: Self-pay | Admitting: Adult Health

## 2022-07-18 VITALS — BP 134/88 | HR 87 | Temp 98.1°F | Ht 70.0 in | Wt 197.0 lb

## 2022-07-18 DIAGNOSIS — G4733 Obstructive sleep apnea (adult) (pediatric): Secondary | ICD-10-CM

## 2022-07-18 NOTE — Progress Notes (Signed)
$'@Patient'E$  ID: Brent Washington, male    DOB: 23-May-1980, 42 y.o.   MRN: [id number]  Chief Complaint  Patient presents with   Follow-up    Referring provider: Vivi Barrack, MD  HPI: 42 year old male seen for sleep consult April 18, 2022 to establish for sleep apnea  TEST/EVENTS :  Home sleep study in March 28, 2022 showed mild sleep apnea with AHI at 13.4/hour and SpO2 low at 78%   07/18/2022 Follow up : OSA  Patient presents for a 5-monthfollow-up.  Patient was seen last visit for a sleep consult to establish for sleep apnea.  Patient had a home sleep study done in November that showed mild sleep apnea.  He was started on CPAP therapy.  Patient says he was wearing his CPAP doing really well with decreased daytime sleepiness.  Unfortunately developed a very severe sinus infection was not able to wear his CPAP for a couple of weeks.  He unfortunately had left water in his machine and noticed that it had mold.  Had a very difficult time getting this out of the tubing and water chamber.  He does not feel comfortable restarting this without new supplies or having his machine checked.  He tried to reach out to his homecare company but was unsuccessful.  Patient wants to restart on his CPAP but needs some help getting in touch with his DME company to get new supplies.   Allergies  Allergen Reactions   Wellbutrin [Bupropion] Anxiety     There is no immunization history on file for this patient.  Past Medical History:  Diagnosis Date   Alcohol abuse    Depression    Hypertension    Tobacco abuse     Tobacco History: Social History   Tobacco Use  Smoking Status Every Day   Years: 20.00   Types: Cigarettes  Smokeless Tobacco Current   Types: Snuff, Chew  Tobacco Comments   1.5 ppd 04/18/22 BR   Ready to quit: No Counseling given: Yes Tobacco comments: 1.5 ppd 04/18/22 BR   Outpatient Medications Prior to Visit  Medication Sig Dispense Refill   ALPRAZolam (XANAX)  0.5 MG tablet Take 1 tablet (0.5 mg total) by mouth at bedtime as needed for anxiety. 30 tablet 5   amLODipine (NORVASC) 10 MG tablet Take 1 tablet by mouth once daily 90 tablet 0   aspirin EC 81 MG tablet Take by mouth.     diclofenac (VOLTAREN) 75 MG EC tablet Take 1 tablet (75 mg total) by mouth 2 (two) times daily. 30 tablet 0   ketoconazole (NIZORAL) 2 % shampoo APPLY 1 APPLICATION TOPICALLY DAILY FOR A WEEK THEN AS NEEDED 120 mL 0   omeprazole (PRILOSEC OTC) 20 MG tablet Take 20 mg by mouth daily.     sertraline (ZOLOFT) 100 MG tablet Take 1 tablet (100 mg total) by mouth daily. Please schedule an appt with Dr. PJerline Painfor further refills [id number] tablet 0   tadalafil (CIALIS) 10 MG tablet TAKE 1/2 TO 1 (ONE-HALF TO ONE) TABLET BY MOUTH ONCE DAILY AS NEEDED FOR ERECTILE DYSFUNCTION 10 tablet 0   tiZANidine (ZANAFLEX) 4 MG tablet Take 1 tablet (4 mg total) by mouth every 6 (six) hours as needed for muscle spasms. 30 tablet 0   traZODone (DESYREL) 50 MG tablet Take 1 tablet (50 mg total) by mouth daily. 30 tablet 0   losartan (COZAAR) 50 MG tablet Take 1 tablet (50 mg total) by mouth daily. 90 tablet 3  No facility-administered medications prior to visit.     Review of Systems:   Constitutional:   No  weight loss, night sweats,  Fevers, chills, fatigue, or  lassitude.  HEENT:   No headaches,  Difficulty swallowing,  Tooth/dental problems, or  Sore throat,                No sneezing, itching, ear ache, nasal congestion, post nasal drip,   CV:  No chest pain,  Orthopnea, PND, swelling in lower extremities, anasarca, dizziness, palpitations, syncope.   GI  No heartburn, indigestion, abdominal pain, nausea, vomiting, diarrhea, change in bowel habits, loss of appetite, bloody stools.   Resp: No shortness of breath with exertion or at rest.  No excess mucus, no productive cough,  No non-productive cough,  No coughing up of blood.  No change in color of mucus.  No wheezing.  No chest  wall deformity  Skin: no rash or lesions.  GU: no dysuria, change in color of urine, no urgency or frequency.  No flank pain, no hematuria   MS:  No joint pain or swelling.  No decreased range of motion.  No back pain.    Physical Exam  BP 134/88 (BP Location: Left Arm, Patient Position: Sitting, Cuff Size: Normal)   Pulse 87   Temp 98.1 F (36.7 C) (Oral)   Ht '5\' 10"'$  (1.778 m)   Wt 197 lb (89.4 kg)   SpO2 99%   BMI 28.27 kg/m   GEN: A/Ox3; pleasant , NAD, well nourished    HEENT:  [place]/AT,   NOSE-clear, THROAT-clear, no lesions, no postnasal drip or exudate noted.  Class III MP airway  NECK:  Supple w/ fair ROM; no JVD; normal carotid impulses w/o bruits; no thyromegaly or nodules palpated; no lymphadenopathy.    RESP  Clear  P & A; w/o, wheezes/ rales/ or rhonchi. no accessory muscle use, no dullness to percussion  CARD:  RRR, no m/r/g, no peripheral edema, pulses intact, no cyanosis or clubbing.  GI:   Soft & nt; nml bowel sounds; no organomegaly or masses detected.   Musco: Warm bil, no deformities or joint swelling noted.   Neuro: alert, no focal deficits noted.    Skin: Warm, no lesions or rashes    Lab Results:  CBC    BNP No results found for: "BNP"  ProBNP No results found for: "PROBNP"  Imaging: No results found.        No data to display          No results found for: "NITRICOXIDE"      Assessment & Plan:   OSA (obstructive sleep apnea) Mild obstructive sleep apnea.-Patient is encouraged to restart CPAP therapy.  We have reached out to his homecare company for an evaluation of his machine and new supplies.  Will check a CPAP download in 4 weeks. Patient education on CPAP machine cleaning.  Plan  Patient Instructions  Take CPAP by [employer] [facility] to be checked and get new supplies.  Restart CPAP At bedtime  , wear all night long, for at least 6hr or more  Download in 4 weeks  Work on healthy weight loss  Do not drive if sleepy   Work on not smoking  Follow up in 4 -6 months and As needed        [employer], NP 07/18/2022

## 2022-07-18 NOTE — Progress Notes (Signed)
Reviewed and agree with assessment/plan.   Chesley Mires, MD [facility] 07/18/2022, 11:48 AM Pager:  [phone]

## 2022-07-18 NOTE — Assessment & Plan Note (Signed)
Mild obstructive sleep apnea.-Patient is encouraged to restart CPAP therapy.  We have reached out to his homecare company for an evaluation of his machine and new supplies.  Will check a CPAP download in 4 weeks. Patient education on CPAP machine cleaning.  Plan  Patient Instructions  Take CPAP by [employer] [facility] to be checked and get new supplies.  Restart CPAP At bedtime  , wear all night long, for at least 6hr or more  Download in 4 weeks  Work on healthy weight loss  Do not drive if sleepy  Work on not smoking  Follow up in 4 -6 months and As needed

## 2022-07-18 NOTE — Patient Instructions (Signed)
Take CPAP by [employer] [facility] to be checked and get new supplies.  Restart CPAP At bedtime  , wear all night long, for at least 6hr or more  Download in 4 weeks  Work on healthy weight loss  Do not drive if sleepy  Work on not smoking  Follow up in 4 -6 months and As needed

## 2022-07-18 NOTE — Telephone Encounter (Signed)
Spoke with Brent Washington, he recommended the patient calling [phone] to be put in touch with the [facility] office on [address] to schedule a time to take his Luna machine in to be evaluated regarding the mold in the water chamber.  I provided the information to the patient and he stated he would call the number to set up an appointment as he was feeling better on the CPAP machine prior to this and would like to get back on it.  Nothing further needed at this time.

## 2022-07-18 NOTE — Telephone Encounter (Signed)
ATC Brad with [employer] regarding patient's CPAP machine (Luna machine).  Patient states that he has not warn it in a few months because he had a sinus infection and then he discovered mold in his water chamber of his machine.  He said he has reached out to [employer] to have someone look at the machine without success.  Requested him to call me back at [phone].

## 2022-07-26 ENCOUNTER — Other Ambulatory Visit: Payer: Self-pay | Admitting: Family Medicine

## 2022-09-04 ENCOUNTER — Other Ambulatory Visit: Payer: Self-pay | Admitting: Family Medicine

## 2022-10-10 ENCOUNTER — Other Ambulatory Visit: Payer: Self-pay | Admitting: Family Medicine

## 2022-10-14 ENCOUNTER — Encounter: Payer: Self-pay | Admitting: Adult Health

## 2022-11-18 ENCOUNTER — Telehealth: Payer: Self-pay | Admitting: Adult Health

## 2022-12-09 ENCOUNTER — Other Ambulatory Visit: Payer: Self-pay | Admitting: Family Medicine

## 2023-01-13 ENCOUNTER — Other Ambulatory Visit: Payer: Self-pay | Admitting: Family Medicine

## 2023-03-07 ENCOUNTER — Other Ambulatory Visit: Payer: Self-pay | Admitting: Family Medicine

## 2023-04-15 ENCOUNTER — Other Ambulatory Visit: Payer: Self-pay | Admitting: Family Medicine

## 2023-05-17 ENCOUNTER — Other Ambulatory Visit: Payer: Self-pay | Admitting: Family Medicine

## 2023-06-01 ENCOUNTER — Other Ambulatory Visit: Payer: Self-pay | Admitting: Family Medicine

## 2023-06-12 ENCOUNTER — Other Ambulatory Visit: Payer: Self-pay | Admitting: Family Medicine

## 2023-06-12 ENCOUNTER — Ambulatory Visit (INDEPENDENT_AMBULATORY_CARE_PROVIDER_SITE_OTHER): Payer: Self-pay | Admitting: Family Medicine

## 2023-06-12 VITALS — BP 147/100 | HR 89 | Temp 97.4°F | Resp 98 | Ht 70.0 in | Wt 196.4 lb

## 2023-06-12 DIAGNOSIS — R21 Rash and other nonspecific skin eruption: Secondary | ICD-10-CM

## 2023-06-12 DIAGNOSIS — I10 Essential (primary) hypertension: Secondary | ICD-10-CM

## 2023-06-12 MED ORDER — CLOBETASOL PROPIONATE 0.05 % EX OINT: 1.0000 | TOPICAL_OINTMENT | Freq: Two times a day (BID) | CUTANEOUS | 0 refills | Status: DC

## 2023-06-12 NOTE — Assessment & Plan Note (Addendum)
Elevated today. He would like to avoid making any medication changes today.  He will continue losartan 50 mg daily and amlodipine 10 mg daily.  He will come back soon for physical and I will monitor at home and let us know if persistently elevated.  If still elevated over the next few weeks or at next office visit we can increase his losartan to 100 mg daily.

## 2023-06-12 NOTE — Telephone Encounter (Signed)
Pharmacy comment: Please clarify the directions  for this prescription. ins plan requires specific grams per application.

## 2023-06-12 NOTE — Progress Notes (Signed)
   Brent Washington is a 43 y.o. male who presents today for an office visit.  Assessment/Plan:  New/Acute Problems: Epidermal inclusion cyst No red flags.  Symptoms are not currently bothersome.  We did discuss referral to dermatology however deferred for now.  Will continue with watchful waiting.  We discussed reasons to return to care.    Chronic Problems Addressed Today: Rash Appearance consistent with psoriasis.  Will start topical clobetasol.  He will follow-up in 1 to 2 weeks if not improving.  If no improvement clobetasol would consider empiric treatment with antifungal versus biopsy at that time.  Essential hypertension Elevated today. He would like to avoid making any medication changes today.  He will continue losartan 50 mg daily and amlodipine 10 mg daily.  He will come back soon for physical and I will monitor at home and let us know if persistently elevated.  If still elevated over the next few weeks or at next office visit we can increase his losartan to 100 mg daily.    Subjective:  HPI:  See Assessment / plan for status of chronic conditions. Patient here with concern for rash. This has been going on for about a month or so. Mildly pruritic. No obvious exposures.  Located on the left chest wall.  No obvious triggering events.  No treatments tried.  Also has a small similar lesion on his left eyebrow as well.  Is also noticed a cyst on the bridge of his nose.   He also does not ongoing issues with dental pain. He has had several tooth fractures.  He has seen dentistry for this in the past has had several extractions.  He has continued to have frequent dental pain and tooth fractures and is interested in getting implants.       Objective:  Physical Exam: BP (!) 147/100   Pulse 89   Temp (!) 97.4 F (36.3 C) (Temporal)   Resp (!) 98   Ht 5\' 10"  (1.778 m)   Wt 196 lb 6.4 oz (89.1 kg)   BMI 28.18 kg/m   Gen: No acute distress, resting comfortably CV: Regular  rate and rhythm with no murmurs appreciated Pulm: Normal work of breathing, clear to auscultation bilaterally with no crackles, wheezes, or rhonchi Skin: A few scattered well-demarcated erythematous plaques with overlying scale on the left flank and medial aspect of left eyebrow.  Please see below picture.  A small approximately 5 mm freely mobile inclusion cyst noted on medial aspect of bridge of left nose. Neuro: Grossly normal, moves all extremities Psych: Normal affect and thought content        Su Duma M. [name], MD 06/12/2023 11:32 AM

## 2023-06-12 NOTE — Assessment & Plan Note (Signed)
Appearance consistent with psoriasis.  Will start topical clobetasol.  He will follow-up in 1 to 2 weeks if not improving.  If no improvement clobetasol would consider empiric treatment with antifungal versus biopsy at that time.

## 2023-06-12 NOTE — Patient Instructions (Addendum)
It was very nice to see you today!  I think you may have a small amount of psoriasis.  Please start the ointment.  Take twice daily for the next week or 2 and let us know if not improving.  You can schedule appointment at the dental clinic for your dental issues.  Please keep an eye on your blood pressure and let us know if persistently elevated.  Please come back soon for your annual physical.=  Return if symptoms worsen or fail to improve, for Annual Physical.   Take care, Dr [name]  PLEASE NOTE:  If you had any lab tests, please let us know if you have not heard back within a few days. You may see your results on mychart before we have a chance to review them but we will give you a call once they are reviewed by [place].   If we ordered any referrals today, please let us know if you have not heard from their office within the next week.   If you had any urgent prescriptions sent in today, please check with the pharmacy within an hour of our visit to make sure the prescription was transmitted appropriately.   Please try these tips to maintain a healthy lifestyle:  Eat at least 3 REAL meals and 1-2 snacks per day.  Aim for no more than 5 hours between eating.  If you eat breakfast, please do so within one hour of getting up.   Each meal should contain half fruits/vegetables, one quarter protein, and one quarter carbs (no bigger than a computer mouse)  Cut down on sweet beverages. This includes juice, soda, and sweet tea.   Drink at least 1 glass of water with each meal and aim for at least 8 glasses per day  Exercise at least 150 minutes every week.

## 2023-06-20 ENCOUNTER — Other Ambulatory Visit: Payer: Self-pay | Admitting: Family Medicine

## 2023-07-09 ENCOUNTER — Other Ambulatory Visit: Payer: Self-pay | Admitting: Family Medicine

## 2023-07-18 ENCOUNTER — Other Ambulatory Visit: Payer: Self-pay | Admitting: Family Medicine

## 2023-08-09 ENCOUNTER — Other Ambulatory Visit: Payer: Self-pay | Admitting: Family Medicine

## 2023-08-26 ENCOUNTER — Other Ambulatory Visit: Payer: Self-pay | Admitting: Family Medicine

## 2023-09-11 ENCOUNTER — Other Ambulatory Visit: Payer: Self-pay | Admitting: Family Medicine

## 2023-09-18 ENCOUNTER — Encounter (HOSPITAL_COMMUNITY): Payer: Self-pay

## 2023-09-25 ENCOUNTER — Ambulatory Visit (INDEPENDENT_AMBULATORY_CARE_PROVIDER_SITE_OTHER): Payer: Self-pay | Admitting: Family Medicine

## 2023-09-25 ENCOUNTER — Telehealth: Payer: Self-pay | Admitting: *Deleted

## 2023-09-25 ENCOUNTER — Encounter: Payer: Self-pay | Admitting: Family Medicine

## 2023-09-25 VITALS — BP 166/96 | HR 93 | Temp 98.0°F | Ht 70.0 in | Wt 192.3 lb

## 2023-09-25 DIAGNOSIS — F419 Anxiety disorder, unspecified: Secondary | ICD-10-CM

## 2023-09-25 DIAGNOSIS — Z131 Encounter for screening for diabetes mellitus: Secondary | ICD-10-CM

## 2023-09-25 DIAGNOSIS — Z13220 Encounter for screening for lipoid disorders: Secondary | ICD-10-CM

## 2023-09-25 DIAGNOSIS — R21 Rash and other nonspecific skin eruption: Secondary | ICD-10-CM

## 2023-09-25 DIAGNOSIS — Z0001 Encounter for general adult medical examination with abnormal findings: Secondary | ICD-10-CM

## 2023-09-25 DIAGNOSIS — F17200 Nicotine dependence, unspecified, uncomplicated: Secondary | ICD-10-CM

## 2023-09-25 DIAGNOSIS — I10 Essential (primary) hypertension: Secondary | ICD-10-CM

## 2023-09-25 LAB — CBC
HCT: 59.8 % — ABNORMAL HIGH (ref 39.0–52.0)
Hemoglobin: 19.7 g/dL (ref 13.0–17.0)
MCHC: 33.4 g/dL (ref 30.0–36.0)
MCV: 80.3 fl (ref 78.0–100.0)
Platelets: 268 10*3/uL (ref 150.0–400.0)
RBC: 7.45 Mil/uL — ABNORMAL HIGH (ref 4.22–5.81)
RDW: 17.9 % — ABNORMAL HIGH (ref 11.5–15.5)
WBC: 5.2 10*3/uL (ref 4.0–10.5)

## 2023-09-25 LAB — TSH: TSH: 2.1 u[IU]/mL (ref 0.35–5.50)

## 2023-09-25 LAB — LIPID PANEL
Cholesterol: 221 mg/dL — ABNORMAL HIGH (ref 0–200)
HDL: 46.9 mg/dL (ref >39.00)
LDL Cholesterol: 119 mg/dL — ABNORMAL HIGH (ref 0–99)
NonHDL: 174.04
Total CHOL/HDL Ratio: 5
Triglycerides: 276 mg/dL — ABNORMAL HIGH (ref 0.0–149.0)
VLDL: 55.2 mg/dL — ABNORMAL HIGH (ref 0.0–40.0)

## 2023-09-25 LAB — HEMOGLOBIN A1C: Hgb A1c MFr Bld: 5.6 % (ref 4.6–6.5)

## 2023-09-25 LAB — COMPREHENSIVE METABOLIC PANEL WITH GFR
ALT: 181 U/L — ABNORMAL HIGH (ref 0–53)
AST: 89 U/L — ABNORMAL HIGH (ref 0–37)
Albumin: 5.2 g/dL (ref 3.5–5.2)
Alkaline Phosphatase: 70 U/L (ref 39–117)
BUN: 11 mg/dL (ref 6–23)
CO2: 24 meq/L (ref 19–32)
Calcium: 9.7 mg/dL (ref 8.4–10.5)
Chloride: 102 meq/L (ref 96–112)
Creatinine, Ser: 0.91 mg/dL (ref 0.40–1.50)
GFR: 102.91 mL/min (ref >60.00)
Glucose, Bld: 100 mg/dL — ABNORMAL HIGH (ref 70–99)
Potassium: 4.3 meq/L (ref 3.5–5.1)
Sodium: 137 meq/L (ref 135–145)
Total Bilirubin: 1 mg/dL (ref 0.2–1.2)
Total Protein: 8 g/dL (ref 6.0–8.3)

## 2023-09-25 LAB — TESTOSTERONE: Testosterone: 155.36 ng/dL — ABNORMAL LOW (ref 300.00–890.00)

## 2023-09-25 MED ORDER — CLOBETASOL PROPIONATE 0.05 % EX OINT: TOPICAL_OINTMENT | Freq: Two times a day (BID) | CUTANEOUS | 3 refills | Status: DC

## 2023-09-25 NOTE — Patient Instructions (Addendum)
 It was very nice to see you today!  It is very important that we work on lowering your blood pressure readings.  Please monitor your blood pressure at home periodically and let us  know if it is persistently elevated.  Please let us  know if you change your mind about increasing the dose of your losartan .  We will check blood work today.  We will refer you for a cardiac CT scan.  They will contact you soon to have this scheduled.  Please continue to work on diet and exercise.  I will refill your clobetasol  today.  I will see you back in year for your next physical.  Please come back to see us  sooner if needed.  Return in about 1 year (around 09/24/2024) for Annual Physical.   Take care, Dr Daneil Dunker  PLEASE NOTE:  If you had any lab tests, please let us  know if you have not heard back within a few days. You may see your results on mychart before we have a chance to review them but we will give you a call once they are reviewed by us .   If we ordered any referrals today, please let us  know if you have not heard from their office within the next week.   If you had any urgent prescriptions sent in today, please check with the pharmacy within an hour of our visit to make sure the prescription was transmitted appropriately.   Please try these tips to maintain a healthy lifestyle:  Eat at least 3 REAL meals and 1-2 snacks per day.  Aim for no more than 5 hours between eating.  If you eat breakfast, please do so within one hour of getting up.   Each meal should contain half fruits/vegetables, one quarter protein, and one quarter carbs (no bigger than a computer mouse)  Cut down on sweet beverages. This includes juice, soda, and sweet tea.   Drink at least 1 glass of water with each meal and aim for at least 8 glasses per day  Exercise at least 150 minutes every week.    Preventive Care 29-53 Years Old, Male Preventive care refers to lifestyle choices and visits with your health care  provider that can promote health and wellness. Preventive care visits are also called wellness exams. What can I expect for my preventive care visit? Counseling During your preventive care visit, your health care provider may ask about your: Medical history, including: Past medical problems. Family medical history. Current health, including: Emotional well-being. Home life and relationship well-being. Sexual activity. Lifestyle, including: Alcohol, nicotine or tobacco, and drug use. Access to firearms. Diet, exercise, and sleep habits. Safety issues such as seatbelt and bike helmet use. Sunscreen use. Work and work Astronomer. Physical exam Your health care provider will check your: Height and weight. These may be used to calculate your BMI (body mass index). BMI is a measurement that tells if you are at a healthy weight. Waist circumference. This measures the distance around your waistline. This measurement also tells if you are at a healthy weight and may help predict your risk of certain diseases, such as type 2 diabetes and high blood pressure. Heart rate and blood pressure. Body temperature. Skin for abnormal spots. What immunizations do I need?  Vaccines are usually given at various ages, according to a schedule. Your health care provider will recommend vaccines for you based on your age, medical history, and lifestyle or other factors, such as travel or where you work. What tests do  I need? Screening Your health care provider may recommend screening tests for certain conditions. This may include: Lipid and cholesterol levels. Diabetes screening. This is done by checking your blood sugar (glucose) after you have not eaten for a while (fasting). Hepatitis B test. Hepatitis C test. HIV (human immunodeficiency virus) test. STI (sexually transmitted infection) testing, if you are at risk. Lung cancer screening. Prostate cancer screening. Colorectal cancer screening. Talk  with your health care provider about your test results, treatment options, and if necessary, the need for more tests. Follow these instructions at home: Eating and drinking  Eat a diet that includes fresh fruits and vegetables, whole grains, lean protein, and low-fat dairy products. Take vitamin and mineral supplements as recommended by your health care provider. Do not drink alcohol if your health care provider tells you not to drink. If you drink alcohol: Limit how much you have to 0-2 drinks a day. Know how much alcohol is in your drink. In the [place] one drink equals one 12 oz bottle of beer (355 mL), one 5 oz glass of wine (148 mL), or one 1 oz glass of hard liquor (44 mL). Lifestyle Brush your teeth every morning and night with fluoride toothpaste. Floss one time each day. Exercise for at least 30 minutes 5 or more days each week. Do not use any products that contain nicotine or tobacco. These products include cigarettes, chewing tobacco, and vaping devices, such as e-cigarettes. If you need help quitting, ask your health care provider. Do not use drugs. If you are sexually active, practice safe sex. Use a condom or other form of protection to prevent STIs. Take aspirin only as told by your health care provider. Make sure that you understand how much to take and what form to take. Work with your health care provider to find out whether it is safe and beneficial for you to take aspirin daily. Find healthy ways to manage stress, such as: Meditation, yoga, or listening to music. Journaling. Talking to a trusted person. Spending time with friends and family. Minimize exposure to UV radiation to reduce your risk of skin cancer. Safety Always wear your seat belt while driving or riding in a vehicle. Do not drive: If you have been drinking alcohol. Do not ride with someone who has been drinking. When you are tired or distracted. While texting. If you have been using any mind-altering  substances or drugs. Wear a helmet and other protective equipment during sports activities. If you have firearms in your house, make sure you follow all gun safety procedures. What's next? Go to your health care provider once a year for an annual wellness visit. Ask your health care provider how often you should have your eyes and teeth checked. Stay up to date on all vaccines. This information is not intended to replace advice given to you by your health care provider. Make sure you discuss any questions you have with your health care provider. Document Revised: 10/25/2020 Document Reviewed: 10/25/2020 Elsevier Patient Education  2024 [employer].

## 2023-09-25 NOTE — Assessment & Plan Note (Signed)
 Encouraged cessation.

## 2023-09-25 NOTE — Telephone Encounter (Signed)
 [facility] called to give critical result, Hemoglobin is 19.7. verbally told Versa Gore, NP of result and sent telephone note.

## 2023-09-25 NOTE — Progress Notes (Signed)
 Chief Complaint:  Brent Washington is a 43 y.o. male who presents today for his annual comprehensive physical exam.    Assessment/Plan:  Chronic Problems Addressed Today: Essential hypertension Elevated today.  We discussed importance of good blood pressure control to prevent long-term complications.  He has been consistent with amlodipine  10 mg daily and losartan  50 mg daily.  He has been elevated at his last few office visits.  We did recommend increasing dose of losartan  100 mg daily however he would like to defer this for now.  He will monitor at home and check with us  in a few weeks.  He will let us  know if he changes his mind before his next office visit.  Will check labs today.  We will order cardiac CT scan to further risk stratify.  Anxiety He has been under more stress recently due to his best friend recently passing away.  Overall this is manageable and he does have good support structure in place.  He is currently on Zoloft  100 mg daily and alprazolam  0.5 mg nightly as needed.  Does not need refill today.  Rash Will refill clobetasol .  He did well with this previously.  Nicotine dependence with current use Encouraged cessation.  Preventative Healthcare: Check labs.  Deferred vaccines.  Patient Counseling(The following topics were reviewed and/or handout was given):  -Nutrition: Stressed importance of moderation in sodium/caffeine intake, saturated fat and cholesterol, caloric balance, sufficient intake of fresh fruits, vegetables, and fiber.  -Stressed the importance of regular exercise.   -Substance Abuse: Discussed cessation/primary prevention of tobacco, alcohol, or other drug use; driving or other dangerous activities under the influence; availability of treatment for abuse.   -Injury prevention: Discussed safety belts, safety helmets, smoke detector, smoking near bedding or upholstery.   -Sexuality: Discussed sexually transmitted diseases, partner selection, use  of condoms, avoidance of unintended pregnancy and contraceptive alternatives.   -Dental health: Discussed importance of regular tooth brushing, flossing, and dental visits.  -Health maintenance and immunizations reviewed. Please refer to Health maintenance section.  Return to care in 1 year for next preventative visit.     Subjective:  HPI:  He has no acute complaints today. See Assessment / plan for status of chronic conditions.  Doing well today.  Lifestyle Diet: None specific but trying to avoid salt Exercise: Minimal.      06/12/2023   10:18 AM  Depression screen PHQ 2/9  Decreased Interest 0  Down, Depressed, Hopeless 0  PHQ - 2 Score 0    Health Maintenance Due  Topic Date Due   DTaP/Tdap/Td (1 - Tdap) Never done     ROS: Per HPI, otherwise a complete review of systems was negative.   PMH:  The following were reviewed and entered/updated in epic: Past Medical History:  Diagnosis Date   Alcohol abuse    Depression    Hypertension    Tobacco abuse    Patient Active Problem List   Diagnosis Date Noted   Rash 06/12/2023   OSA (obstructive sleep apnea) 04/18/2022   Low libido 06/07/2021   Tinea versicolor 09/29/2020   Pruritus ani 09/29/2020   Erectile dysfunction 09/29/2020   Essential hypertension 05/15/2018   Anxiety 05/15/2018   Nicotine dependence with current use 05/15/2018   Alcohol use 05/15/2018   History reviewed. No pertinent surgical history.  Family History  Problem Relation Age of Onset   Hypertension Mother    Depression Mother    Early death Father    Heart disease  Father    Hypertension Father    Heart disease Maternal Uncle     Medications- reviewed and updated Current Outpatient Medications  Medication Sig Dispense Refill   ALPRAZolam  (XANAX ) 0.5 MG tablet TAKE 1 TABLET BY MOUTH AT BEDTIME AS NEEDED FOR ANXIETY 30 tablet 0   amLODipine  (NORVASC ) 10 MG tablet Take 1 tablet (10 mg total) by mouth daily. Please keep schedule  office visit for additional refills. 30 tablet 2   losartan  (COZAAR ) 50 MG tablet TAKE 1 TABLET BY MOUTH ONCE DAILY. APPOINTMENT REQUIRED FOR FUTURE REFILLS 30 tablet 0   sertraline  (ZOLOFT ) 100 MG tablet TAKE 1 TABLET BY MOUTH ONCE DAILY . APPOINTMENT REQUIRED FOR FUTURE REFILLS 90 tablet 3   tadalafil  (CIALIS ) 10 MG tablet TAKE 1/2 TO 1 (ONE-HALF TO ONE) TABLET BY MOUTH ONCE DAILY AS NEEDED FOR ERECTILE DYSFUNCTION 10 tablet 0   clobetasol  ointment (TEMOVATE ) 0.05 % Apply topically 2 (two) times daily. 1 gram twice daily. 60 g 3   No current facility-administered medications for this visit.    Allergies-reviewed and updated Allergies  Allergen Reactions   Wellbutrin [Bupropion] Anxiety    Social History   Socioeconomic History   Marital status: Single    Spouse name: Not on file   Number of children: Not on file   Years of education: Not on file   Highest education level: Not on file  Occupational History   Not on file  Tobacco Use   Smoking status: Every Day    Types: Cigarettes   Smokeless tobacco: Current    Types: Snuff, Chew   Tobacco comments:    1.5 ppd 04/18/22 BR  Vaping Use   Vaping status: Never Used  Substance and Sexual Activity   Alcohol use: Yes    Alcohol/week: 20.0 standard drinks of alcohol    Types: 20 Shots of liquor per week   Drug use: Never   Sexual activity: Yes  Other Topics Concern   Not on file  Social History Narrative   Not on file   Social Drivers of Health   Financial Resource Strain: Not on file  Food Insecurity: Not on file  Transportation Needs: Not on file  Physical Activity: Not on file  Stress: Not on file  Social Connections: Unknown (12/06/2022)   [facility]   Social Network    Social Network: Not on file        Objective:  Physical Exam: BP (!) 150/100   Pulse 93   Temp 98 F (36.7 C) (Temporal)   Ht 5\' 10"  (1.778 m)   Wt 192 lb 4.8 oz (87.2 kg)   SpO2 98%   BMI 27.59 kg/m   Body mass index  is 27.59 kg/m. Wt Readings from Last 3 Encounters:  09/25/23 192 lb 4.8 oz (87.2 kg)  06/12/23 196 lb 6.4 oz (89.1 kg)  07/18/22 197 lb (89.4 kg)   Gen: NAD, resting comfortably HEENT: TMs normal bilaterally. OP clear. No thyromegaly noted.  CV: RRR with no murmurs appreciated Pulm: NWOB, CTAB with no crackles, wheezes, or rhonchi GI: Normal bowel sounds present. Soft, Nontender, Nondistended. MSK: no edema, cyanosis, or clubbing noted Skin: warm, dry Neuro: CN2-12 grossly intact. Strength 5/5 in upper and lower extremities. Reflexes symmetric and intact bilaterally.  Psych: Normal affect and thought content  EKG: Normal sinus rhythm.  No acute ischemic changes.  Similar appearance to previous EKGs.  Left atrial enlargement and incomplete right bundle blanch block with left axis anterior  fascicular block.     Jinny Mounts. Daneil Dunker, MD 09/25/2023 11:03 AM

## 2023-09-25 NOTE — Assessment & Plan Note (Signed)
 Will refill clobetasol .  He did well with this previously.

## 2023-09-25 NOTE — Assessment & Plan Note (Signed)
 He has been under more stress recently due to his best friend recently passing away.  Overall this is manageable and he does have good support structure in place.  He is currently on Zoloft  100 mg daily and alprazolam  0.5 mg nightly as needed.  Does not need refill today.

## 2023-09-25 NOTE — Assessment & Plan Note (Addendum)
 Elevated today.  We discussed importance of good blood pressure control to prevent long-term complications.  He has been consistent with amlodipine  10 mg daily and losartan  50 mg daily.  He has been elevated at his last few office visits.  We did recommend increasing dose of losartan  100 mg daily however he would like to defer this for now.  He will monitor at home and check with us  in a few weeks.  He will let us  know if he changes his mind before his next office visit.  Will check labs today.  We will order cardiac CT scan to further risk stratify.

## 2023-09-25 NOTE — Telephone Encounter (Signed)
 PCP notified of result and will address.

## 2023-09-26 ENCOUNTER — Ambulatory Visit: Payer: Self-pay | Admitting: Family Medicine

## 2023-09-26 DIAGNOSIS — R935 Abnormal findings on diagnostic imaging of other abdominal regions, including retroperitoneum: Secondary | ICD-10-CM

## 2023-09-26 NOTE — Progress Notes (Signed)
 His red blood cell counts are elevated.  They have been elevated for the last few years however are trending up.  This may be related to his smoking however I think it is a good idea for him to see a hematologist for further evaluation.  Please place referral.  His liver numbers are also elevated.  Recommend he come back to recheck.  Please place future order for CMET.    Cholesterol is mildly elevated.  Do not need to start meds for this however he should work on diet and exercise and we can recheck in a year.  His testosterone is a bit low.  We can recheck this again in a couple of weeks though he is not a candidate for testosterone replacement with his elevated red blood cell counts.  The rest of his labs are all at goal.  We can recheck in a year.

## 2023-09-29 ENCOUNTER — Other Ambulatory Visit: Payer: Self-pay | Admitting: *Deleted

## 2023-09-29 DIAGNOSIS — E349 Endocrine disorder, unspecified: Secondary | ICD-10-CM

## 2023-09-29 DIAGNOSIS — R799 Abnormal finding of blood chemistry, unspecified: Secondary | ICD-10-CM

## 2023-10-08 ENCOUNTER — Other Ambulatory Visit: Payer: Self-pay | Admitting: Family Medicine

## 2023-10-16 ENCOUNTER — Other Ambulatory Visit: Payer: Self-pay | Admitting: Family Medicine

## 2023-10-16 ENCOUNTER — Other Ambulatory Visit: Payer: Self-pay

## 2023-10-16 DIAGNOSIS — R799 Abnormal finding of blood chemistry, unspecified: Secondary | ICD-10-CM

## 2023-10-16 DIAGNOSIS — E349 Endocrine disorder, unspecified: Secondary | ICD-10-CM

## 2023-10-16 LAB — TESTOSTERONE: Testosterone: 169.42 ng/dL — ABNORMAL LOW (ref 300.00–890.00)

## 2023-10-16 NOTE — Telephone Encounter (Signed)
 Pt requesting refill for Alprazolam  0.5 mg tablet. Last OV 09/25/2023

## 2023-10-17 ENCOUNTER — Ambulatory Visit: Payer: Self-pay | Admitting: Family Medicine

## 2023-10-17 DIAGNOSIS — R748 Abnormal levels of other serum enzymes: Secondary | ICD-10-CM

## 2023-10-17 LAB — COMPREHENSIVE METABOLIC PANEL WITH GFR
ALT: 156 U/L — ABNORMAL HIGH (ref 0–53)
AST: 93 U/L — ABNORMAL HIGH (ref 0–37)
Albumin: 5.2 g/dL (ref 3.5–5.2)
Alkaline Phosphatase: 69 U/L (ref 39–117)
BUN: 13 mg/dL (ref 6–23)
CO2: 19 meq/L (ref 19–32)
Calcium: 10 mg/dL (ref 8.4–10.5)
Chloride: 102 meq/L (ref 96–112)
Creatinine, Ser: 0.86 mg/dL (ref 0.40–1.50)
GFR: 105.68 mL/min (ref >60.00)
Glucose, Bld: 90 mg/dL (ref 70–99)
Potassium: 4.3 meq/L (ref 3.5–5.1)
Sodium: 139 meq/L (ref 135–145)
Total Bilirubin: 0.8 mg/dL (ref 0.2–1.2)
Total Protein: 8 g/dL (ref 6.0–8.3)

## 2023-10-17 NOTE — Progress Notes (Signed)
 His liver numbers are still elevated though slightly improved.  Recommend we check right upper quadrant ultrasound to further evaluate his liver.  Please place order.

## 2023-10-21 ENCOUNTER — Other Ambulatory Visit: Payer: Self-pay | Admitting: *Deleted

## 2023-10-27 ENCOUNTER — Ambulatory Visit: Payer: Self-pay | Admitting: Family Medicine

## 2023-10-27 ENCOUNTER — Ambulatory Visit
Admission: RE | Admit: 2023-10-27 | Discharge: 2023-10-27 | Disposition: A | Discharge status: Home / Self Care | Payer: Self-pay | Source: Ambulatory Visit | Attending: Family Medicine | Admitting: Family Medicine

## 2023-10-27 DIAGNOSIS — R748 Abnormal levels of other serum enzymes: Secondary | ICD-10-CM

## 2023-10-27 NOTE — Progress Notes (Signed)
 His ultrasound shows fatty liver disease.  He should continue to work on diet and exercise and avoid high sugar and high fat diet.  He should also continue to work on avoiding alcohol intake.  We can monitor his liver numbers with blood work at his next visit here.

## 2023-11-03 ENCOUNTER — Other Ambulatory Visit: Payer: Self-pay | Admitting: Family Medicine

## 2023-11-03 ENCOUNTER — Ambulatory Visit (HOSPITAL_BASED_OUTPATIENT_CLINIC_OR_DEPARTMENT_OTHER)
Admission: RE | Admit: 2023-11-03 | Discharge: 2023-11-03 | Disposition: A | Discharge status: Home / Self Care | Payer: Self-pay | Source: Ambulatory Visit | Attending: Family Medicine | Admitting: Family Medicine

## 2023-11-03 DIAGNOSIS — I10 Essential (primary) hypertension: Secondary | ICD-10-CM | POA: Insufficient documentation

## 2023-11-03 DIAGNOSIS — Z0001 Encounter for general adult medical examination with abnormal findings: Secondary | ICD-10-CM | POA: Insufficient documentation

## 2023-11-03 NOTE — Progress Notes (Signed)
 CT scan shows calcification in a few of the arteries in his heart.  This is not an urgent concern however does mean that we need to be more aggressive with his risk factor modification.  Recommend referral to cardiology for further evaluation and testing.

## 2023-11-04 ENCOUNTER — Telehealth: Payer: Self-pay | Admitting: Oncology

## 2023-11-04 ENCOUNTER — Encounter: Payer: Self-pay | Admitting: Oncology

## 2023-11-04 ENCOUNTER — Inpatient Hospital Stay: Payer: Self-pay | Attending: Oncology | Admitting: Oncology

## 2023-11-04 ENCOUNTER — Inpatient Hospital Stay: Payer: Self-pay

## 2023-11-04 VITALS — BP 144/95 | HR 99 | Temp 98.2°F | Resp 18 | Ht 70.0 in | Wt 191.0 lb

## 2023-11-04 DIAGNOSIS — F17210 Nicotine dependence, cigarettes, uncomplicated: Secondary | ICD-10-CM | POA: Insufficient documentation

## 2023-11-04 DIAGNOSIS — D751 Secondary polycythemia: Secondary | ICD-10-CM

## 2023-11-04 DIAGNOSIS — G4733 Obstructive sleep apnea (adult) (pediatric): Secondary | ICD-10-CM | POA: Insufficient documentation

## 2023-11-04 DIAGNOSIS — F17200 Nicotine dependence, unspecified, uncomplicated: Secondary | ICD-10-CM

## 2023-11-04 LAB — CBC WITH DIFFERENTIAL (CANCER CENTER ONLY)
Abs Immature Granulocytes: 0.02 10*3/uL (ref 0.00–0.07)
Basophils Absolute: 0.1 10*3/uL (ref 0.0–0.1)
Basophils Relative: 2 %
Eosinophils Absolute: 0.2 10*3/uL (ref 0.0–0.5)
Eosinophils Relative: 5 %
HCT: 60.1 % — ABNORMAL HIGH (ref 39.0–52.0)
Hemoglobin: 19.9 g/dL — ABNORMAL HIGH (ref 13.0–17.0)
Immature Granulocytes: 0 %
Lymphocytes Relative: 20 %
Lymphs Abs: 0.9 10*3/uL (ref 0.7–4.0)
MCH: 26.7 pg (ref 26.0–34.0)
MCHC: 33.1 g/dL (ref 30.0–36.0)
MCV: 80.8 fL (ref 80.0–100.0)
Monocytes Absolute: 0.4 10*3/uL (ref 0.1–1.0)
Monocytes Relative: 9 %
Neutro Abs: 3 10*3/uL (ref 1.7–7.7)
Neutrophils Relative %: 64 %
Platelet Count: 239 10*3/uL (ref 150–400)
RBC: 7.44 MIL/uL — ABNORMAL HIGH (ref 4.22–5.81)
RDW: 18.1 % — ABNORMAL HIGH (ref 11.5–15.5)
WBC Count: 4.7 10*3/uL (ref 4.0–10.5)
nRBC: 0 % (ref 0.0–0.2)

## 2023-11-04 NOTE — Telephone Encounter (Signed)
 Patient has been scheduled for follow-up visit per 11/04/23 LOS.  Pt noted appt details on personal electronic device.

## 2023-11-04 NOTE — Progress Notes (Unsigned)
 [facility]  HEMATOLOGY CLINIC CONSULTATION NOTE    PATIENT NAME: Brent Washington   MR#: [id number] DOB: 1980-10-25  DATE OF SERVICE: 11/04/2023   REFERRING PHYSICIAN  Brent Worth HERO, MD   Patient Care Team: Brent Worth HERO, MD as PCP - General (Family Medicine) Washington, Brent Ned, MD as PCP - Cardiology (Cardiology)    REASON FOR CONSULTATION/ CHIEF COMPLAINT: Polycythemia  ASSESSMENT & PLAN:  Brent Washington is a 43 y.o. gentleman with past medical history of obstructive sleep apnea (not on CPAP), nicotine dependence, alcohol use, hypertension, depression, was referred to our clinic for evaluation of polycythemia.  Polycythemia Clinical picture is consistent with secondary polycythemia, due to tobacco use and sleep apnea. Hemoglobin was 19.7 in May, and hematocrit was 59, indicating elevated red cell mass.   This is a compensatory mechanism from low oxygen levels due to smoking and sleep apnea. Risks with elevated hematocrit include thrombosis, stroke, and cardiovascular complications. Target hematocrit is below 55 to mitigate these risks.    Labs today revealed hemoglobin of 19.9, hematocrit 60.1.  White count 4700 with normal differential and platelet count normal at 239,000.  Patient is self-pay and deferred additional workup and this is reasonable.  He will attempt to donate blood at a blood bank, so as to bring down his hematocrit.  Since some blood bank's do not accept donations with elevated hematocrit, we may have to arrange therapeutic phlebotomy next week.  He will inform our clinic if he is not successful in donating blood.  He was strongly encouraged to quit smoking.  Currently smoking 2 packs/day.  - Encourage hydration with 80-90 ounces of water daily  For now I will plan to see him again in 3 months with repeat labs.  If he is not able to donate blood, we will arrange for therapeutic phlebotomies at least every 4 weeks, provided  it is not cost prohibitive for the patient.  Nicotine dependence with current use Smoking two packs per day, contributing to secondary polycythemia and elevated calcium score. Discussed smoking's impact on oxygen transport and the importance of cessation to reduce risks of cardiovascular and pulmonary complications. - Advise gradual smoking reduction with cessation goal in three months - Discuss risks of continued smoking, including cardiovascular and oncological risks  OSA (obstructive sleep apnea) Diagnosed two years ago with 15 apnea episodes during sleep study. Non-compliance with CPAP therapy may exacerbate secondary polycythemia due to intermittent nocturnal hypoxia.   Elevated coronary calcium score Elevated calcium score on recent CT coronary scan suggests potential coronary artery disease. No severe angina or significant dyspnea, but exertional dyspnea present. Cardiology referral recommended for further evaluation. - His PCP referred him to cardiology for further evaluation and testing  Elevated liver enzymes Mild elevation in liver enzymes, possibly related to alcohol use. - Advise moderation in alcohol consumption    I reviewed lab results and outside records for this visit and discussed relevant results with the patient. Diagnosis, plan of care and treatment options were also discussed in detail with the patient. Opportunity provided to ask questions and answers provided to his apparent satisfaction. Provided instructions to call our clinic with any problems, questions or concerns prior to return visit. I recommended to continue follow-up with PCP and sub-specialists. He verbalized understanding and agreed with the plan. No barriers to learning was detected.  Brent Patten, MD  11/04/2023 10:39 AM  [facility] - A DEPT OF [facility]  [facility] [address] [place] [address] Dept: [phone] Dept Fax:  [phone]   HISTORY OF PRESENTING ILLNESS:   Discussed the use of AI scribe software for clinical note transcription with the patient, who gave verbal consent to proceed.  History of Present Illness Brent Washington is a 43 year old male who presents with high red blood cell count. He was referred by Dr. Kennyth for evaluation of high red blood cell count.  On 09/25/2023, routine labs at his PCPs office showed hemoglobin of 19.7, hematocrit 59.8, normal white count and platelet count.  This is the first time he has been informed of this issue. A recent CT coronary scan showed a high calcium score.  No chest pain or severe trouble breathing, but he experiences shortness of breath with exertion. No shortness of breath during regular walking. He has had a decreased appetite for quite some time but is maintaining his weight.  He was diagnosed with sleep apnea two years ago and was provided with a CPAP machine, which he has not been using. He had 15 episodes of apnea during his sleep study.  He smokes two packs of cigarettes a day and drinks alcohol. He reports staying well-hydrated, drinking a lot of water daily.    MEDICAL HISTORY:  Past Medical History:  Diagnosis Date   Alcohol abuse    Depression    Hypertension    Tobacco abuse     SURGICAL HISTORY: No past surgical history on file.  SOCIAL HISTORY:  He reports that he has been smoking cigarettes. His smokeless tobacco use includes snuff and chew. He reports current alcohol use of about 20.0 standard drinks of alcohol per week. He reports that he does not use drugs.  Social History   Socioeconomic History   Marital status: Single    Spouse name: Not on file   Number of children: Not on file   Years of education: Not on file   Highest education level: Not on file  Occupational History   Not on file  Tobacco Use   Smoking status: Every Day    Types: Cigarettes   Smokeless tobacco: Current    Types: Snuff,  Chew   Tobacco comments:    2 PPD as of 11/04/23  Vaping Use   Vaping status: Never Used  Substance and Sexual Activity   Alcohol use: Yes    Alcohol/week: 20.0 standard drinks of alcohol    Types: 20 Shots of liquor per week   Drug use: Never   Sexual activity: Yes  Other Topics Concern   Not on file  Social History Narrative   Not on file   Social Drivers of Health   Financial Resource Strain: Not on file  Food Insecurity: Not on file  Transportation Needs: Not on file  Physical Activity: Not on file  Stress: Not on file  Social Connections: Unknown (12/06/2022)   Received from [facility]   Social Network    Social Network: Not on file  Intimate Partner Violence: Unknown (12/06/2022)   Received from [facility]   HITS    Physically Hurt: Not on file    Insult or Talk Down To: Not on file    Threaten Physical Harm: Not on file    Scream or Curse: Not on file    FAMILY HISTORY: Family History  Problem Relation Age of Onset   Hypertension Mother    Depression Mother    Early death Father    Heart  disease Father    Hypertension Father    Heart disease Maternal Uncle     CURRENT MEDICATIONS   Current Outpatient Medications  Medication Instructions   ALPRAZolam  (XANAX ) 0.5 MG tablet TAKE 1 TABLET BY MOUTH AT BEDTIME AS NEEDED FOR ANXIETY   amLODipine  (NORVASC ) 10 MG tablet TAKE 1 TABLET BY MOUTH ONCE DAILY . APPOINTMENT REQUIRED FOR FUTURE REFILLS   losartan  (COZAAR ) 50 MG tablet TAKE 1 TABLET BY MOUTH ONCE DAILY **APPOINTMENT REQUIRED FOR FUTURE REFILLS**   sertraline  (ZOLOFT ) 100 MG tablet TAKE 1 TABLET BY MOUTH ONCE DAILY . APPOINTMENT REQUIRED FOR FUTURE REFILLS   tadalafil  (CIALIS ) 10 MG tablet TAKE 1/2 TO 1 (ONE-HALF TO ONE) TABLET BY MOUTH ONCE DAILY AS NEEDED FOR ERECTILE DYSFUNCTION     ALLERGIES  He is allergic to wellbutrin [bupropion].  REVIEW OF SYSTEMS:    Review of Systems  Constitutional:  Positive for appetite change.  Respiratory:   Positive for shortness of breath.     All of the other pertinent systems were reviewed with the patient and were unremarkable except as mentioned above in HPI.  PHYSICAL EXAMINATION:    Onc Performance Status - 11/04/23 1013       ECOG Perf Status   ECOG Perf Status Restricted in physically strenuous activity but ambulatory and able to carry out work of a light or sedentary nature, e.g., light house work, office work      KPS SCALE   KPS % SCORE Normal activity with effort, some s/s of disease          Vitals:   11/04/23 1009  BP: (!) 144/95  Pulse: 99  Resp: 18  Temp: 98.2 F (36.8 C)  SpO2: 97%   Filed Weights   11/04/23 1009  Weight: 191 lb (86.6 kg)    Physical Exam Constitutional:      General: He is not in acute distress.    Appearance: Normal appearance.  HENT:     Head: Normocephalic and atraumatic.   Eyes:     Conjunctiva/sclera: Conjunctivae normal.    Cardiovascular:     Rate and Rhythm: Normal rate and regular rhythm.     Heart sounds: Normal heart sounds.  Pulmonary:     Effort: Pulmonary effort is normal. No respiratory distress.     Breath sounds: Normal breath sounds.  Abdominal:     General: There is no distension.     Palpations: There is no mass.   Neurological:     General: No focal deficit present.     Mental Status: He is alert and oriented to person, place, and time.   Psychiatric:        Mood and Affect: Mood normal.        Behavior: Behavior normal.      LABORATORY DATA:   I have reviewed the data as listed.  Results for orders placed or performed in visit on 11/04/23  CBC with Differential (Cancer Center Only)  Result Value Ref Range   WBC Count 4.7 4.0 - 10.5 K/uL   RBC 7.44 (H) 4.22 - 5.81 MIL/uL   Hemoglobin 19.9 (H) 13.0 - 17.0 g/dL   HCT 39.8 (H) 60.9 - 47.9 %   MCV 80.8 80.0 - 100.0 fL   MCH 26.7 26.0 - 34.0 pg   MCHC 33.1 30.0 - 36.0 g/dL   RDW 81.8 (H) 88.4 - 84.4 %   Platelet Count 239 150 - 400 K/uL    nRBC 0.0 0.0 - 0.2 %  Neutrophils Relative % 64 %   Neutro Abs 3.0 1.7 - 7.7 K/uL   Lymphocytes Relative 20 %   Lymphs Abs 0.9 0.7 - 4.0 K/uL   Monocytes Relative 9 %   Monocytes Absolute 0.4 0.1 - 1.0 K/uL   Eosinophils Relative 5 %   Eosinophils Absolute 0.2 0.0 - 0.5 K/uL   Basophils Relative 2 %   Basophils Absolute 0.1 0.0 - 0.1 K/uL   Immature Granulocytes 0 %   Abs Immature Granulocytes 0.02 0.00 - 0.07 K/uL    Recent Results (from the past 2160 hours)  CBC     Status: Abnormal   Collection Time: 09/25/23 10:45 AM  Result Value Ref Range   WBC 5.2 4.0 - 10.5 K/uL   RBC 7.45 (H) 4.22 - 5.81 Mil/uL   Platelets 268.0 150.0 - 400.0 K/uL   Hemoglobin 19.7 (HH) 13.0 - 17.0 g/dL   HCT 40.1 (H) 60.9 - 47.9 %   MCV 80.3 78.0 - 100.0 fl   MCHC 33.4 30.0 - 36.0 g/dL   RDW 82.0 (H) 88.4 - 84.4 %  Comprehensive metabolic panel with GFR     Status: Abnormal   Collection Time: 09/25/23 10:45 AM  Result Value Ref Range   Sodium 137 135 - 145 mEq/L   Potassium 4.3 3.5 - 5.1 mEq/L   Chloride 102 96 - 112 mEq/L   CO2 24 19 - 32 mEq/L   Glucose, Bld 100 (H) 70 - 99 mg/dL   BUN 11 6 - 23 mg/dL   Creatinine, Ser 9.08 0.40 - 1.50 mg/dL   Total Bilirubin 1.0 0.2 - 1.2 mg/dL   Alkaline Phosphatase 70 39 - 117 U/L   AST 89 (H) 0 - 37 U/L   ALT 181 (H) 0 - 53 U/L   Total Protein 8.0 6.0 - 8.3 g/dL   Albumin 5.2 3.5 - 5.2 g/dL   GFR 897.08 >39.99 mL/min    Comment: Calculated using the CKD-EPI Creatinine Equation (2021)   Calcium 9.7 8.4 - 10.5 mg/dL  Hemoglobin J8r     Status: None   Collection Time: 09/25/23 10:45 AM  Result Value Ref Range   Hgb A1c MFr Bld 5.6 4.6 - 6.5 %    Comment: Glycemic Control Guidelines for People with Diabetes:Non Diabetic:  <6%Goal of Therapy: <7%Additional Action Suggested:  >8%   TSH     Status: None   Collection Time: 09/25/23 10:45 AM  Result Value Ref Range   TSH 2.10 0.35 - 5.50 uIU/mL  Lipid panel     Status: Abnormal   Collection Time:  09/25/23 10:45 AM  Result Value Ref Range   Cholesterol 221 (H) 0 - 200 mg/dL    Comment: ATP III Classification       Desirable:  < 200 mg/dL               Borderline High:  200 - 239 mg/dL          High:  > = 759 mg/dL   Triglycerides 723.9 (H) 0.0 - 149.0 mg/dL    Comment: Normal:  <849 mg/dLBorderline High:  150 - 199 mg/dL   HDL 53.09 >60.99 mg/dL   VLDL 44.7 (H) 0.0 - 59.9 mg/dL   LDL Cholesterol 880 (H) 0 - 99 mg/dL   Total CHOL/HDL Ratio 5     Comment:                Men  Women1/2 Average Risk     3.4          3.3Average Risk          5.0          4.42X Average Risk          9.6          7.13X Average Risk          15.0          11.0                       NonHDL 174.04     Comment: NOTE:  Non-HDL goal should be 30 mg/dL higher than patient's LDL goal (i.e. LDL goal of < 70 mg/dL, would have non-HDL goal of < 100 mg/dL)  Testosterone      Status: Abnormal   Collection Time: 09/25/23 10:45 AM  Result Value Ref Range   Testosterone  155.36 (L) 300.00 - 890.00 ng/dL  Testosterone      Status: Abnormal   Collection Time: 10/16/23  9:56 AM  Result Value Ref Range   Testosterone  169.42 (L) 300.00 - 890.00 ng/dL  Comp Met (CMET)     Status: Abnormal   Collection Time: 10/16/23  9:56 AM  Result Value Ref Range   Sodium 139 135 - 145 mEq/L   Potassium 4.3 3.5 - 5.1 mEq/L   Chloride 102 96 - 112 mEq/L   CO2 19 19 - 32 mEq/L   Glucose, Bld 90 70 - 99 mg/dL   BUN 13 6 - 23 mg/dL   Creatinine, Ser 9.13 0.40 - 1.50 mg/dL   Total Bilirubin 0.8 0.2 - 1.2 mg/dL   Alkaline Phosphatase 69 39 - 117 U/L   AST 93 (H) 0 - 37 U/L   ALT 156 (H) 0 - 53 U/L   Total Protein 8.0 6.0 - 8.3 g/dL   Albumin 5.2 3.5 - 5.2 g/dL   GFR 894.31 >39.99 mL/min    Comment: Calculated using the CKD-EPI Creatinine Equation (2021)   Calcium 10.0 8.4 - 10.5 mg/dL     RADIOGRAPHIC STUDIES:  I have personally reviewed the radiological images as listed and agree with the findings in the report.  CT CARDIAC  SCORING (SELF PAY ONLY) Result Date: 11/03/2023 CLINICAL DATA:  Cardiovascular Disease Risk stratification EXAM: Coronary Calcium Score TECHNIQUE: A gated, non-contrast computed tomography scan of the heart was performed using 3 mm slice thickness. Axial images were analyzed on a dedicated workstation. Calcium scoring of the coronary arteries was performed using the Agatston method. FINDINGS: Coronary arteries: Normal origins. Coronary Calcium Score: Left main: 96.9 Left anterior descending artery: 127 Left circumflex artery: 0 Right coronary artery: 1.8 Total: 226 Pericardium: Normal. Aorta: Normal caliber.  Aortic atherosclerosis. Non-cardiac: See separate report from [facility]. IMPRESSION: 1. Coronary calcium score of 226. Not compared to [facility] database due to age <45, however, would be considered very high risk/abnormal (>99th percentile). 2. Aortic atherosclerosis. RECOMMENDATIONS: Coronary artery calcium (CAC) score is a strong predictor of incident coronary heart disease (CHD) and provides predictive information beyond traditional risk factors. CAC scoring is reasonable to use in the decision to withhold, postpone, or initiate statin therapy in intermediate-risk or selected borderline-risk asymptomatic adults (age 9-75 years and LDL-C >=70 to <190 mg/dL) who do not have diabetes or established atherosclerotic cardiovascular disease (ASCVD).* In intermediate-risk (10-year ASCVD risk >=7.5% to <20%) adults or selected borderline-risk (10-year ASCVD risk >=5% to <7.5%) adults in whom a CAC  score is measured for the purpose of making a treatment decision the following recommendations have been made: If CAC=0, it is reasonable to withhold statin therapy and reassess in 5 to 10 years, as long as higher risk conditions are absent (diabetes mellitus, family history of premature CHD in first degree relatives (males <55 years; females <65 years), cigarette smoking, or LDL >=190 mg/dL). If CAC is 1 to 99, it  is reasonable to initiate statin therapy for patients >=25 years of age. If CAC is >=100 or >=75th percentile, it is reasonable to initiate statin therapy at any age. Cardiology referral should be considered for patients with CAC scores >=400 or >=75th percentile. *2018 AHA/ACC/AACVPR/AAPA/ABC/ACPM/ADA/AGS/APhA/ASPC/NLA/PCNA Guideline on the Management of Blood Cholesterol: A Report of the American College of Cardiology/American Heart Association Task Force on Clinical Practice Guidelines. J Am Coll Cardiol. 2019;73(24):3168-3209. Electronically Signed   By: Vinie JAYSON Maxcy M.D.   On: 11/03/2023 09:58   US  Abdomen Limited RUQ (LIVER/GB) Result Date: 10/27/2023 CLINICAL DATA:  Abnormal liver enzymes EXAM: ULTRASOUND ABDOMEN LIMITED RIGHT UPPER QUADRANT COMPARISON:  None Available. FINDINGS: Gallbladder: No gallstones or wall thickening visualized. No sonographic Murphy sign noted by sonographer. Common bile duct: Diameter: 2 mm Liver: Diffusely echogenic hepatic parenchyma. With this level of echogenicity evaluation for underlying mass lesion is limited and if needed follow-up contrast CT or MRI as clinically appropriate. Portal vein is patent on color Doppler imaging with normal direction of blood flow towards the liver. Other: None. IMPRESSION: Fatty liver infiltration.  No gallstones or ductal dilatation. Electronically Signed   By: Ranell Bring M.D.   On: 10/27/2023 12:59     Orders Placed This Encounter  Procedures   CBC with Differential (Cancer Center Only)    Standing Status:   Future    Number of Occurrences:   1    Expiration Date:   11/03/2024   CBC with Differential (Cancer Center Only)    Standing Status:   Future    Expected Date:   02/02/2024    Expiration Date:   11/04/2024    Future Appointments  Date Time Provider Department Center  02/02/2024  8:00 AM [facility] PHLEBOTOMIST [facility] None  02/02/2024  8:30 AM Kamden Stanislaw, [place], MD [facility] None      I spent a total of 40 minutes  during this encounter with the patient including review of chart and various tests results, discussions about plan of care and coordination of care plan.   This document was completed utilizing speech recognition software. Grammatical errors, random word insertions, pronoun errors, and incomplete sentences are an occasional consequence of this system due to software limitations, ambient noise, and hardware issues. Any formal questions or concerns about the content, text or information contained within the body of this dictation should be directly addressed to the provider for clarification.

## 2023-11-05 ENCOUNTER — Encounter: Payer: Self-pay | Admitting: Oncology

## 2023-11-05 DIAGNOSIS — D751 Secondary polycythemia: Secondary | ICD-10-CM | POA: Insufficient documentation

## 2023-11-05 NOTE — Assessment & Plan Note (Signed)
 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 Hemoglobin was 19.7 in May, and hematocrit was 59, indicating elevated red cell mass.   This is a compensatory mechanism from low oxygen levels due to smoking and sleep apnea. Risks with elevated hematocrit include thrombosis, stroke, and cardiovascular complications. Target hematocrit is below 55 to mitigate these risks.    Labs today revealed hemoglobin of 19.9, hematocrit 60.1.  White count 4700 with normal differential and platelet count normal at 239,000.  Patient is self-pay and deferred additional workup and this is reasonable.  He will attempt to donate blood at a blood bank, so as to bring down his hematocrit.  Since some blood bank's do not accept donations with elevated hematocrit, we may have to arrange therapeutic phlebotomy next week.  He will inform our clinic if he is not successful in donating blood.  He was strongly encouraged to quit smoking.  Currently smoking 2 packs/day.  - Encourage hydration with 80-90 ounces of water daily  For now I will plan to see him again in 3 months with repeat labs.  If he is not able to donate blood, we will arrange for therapeutic phlebotomies at least every 4 weeks, provided it is not cost prohibitive for the patient.

## 2023-11-05 NOTE — Assessment & Plan Note (Signed)
 Diagnosed two years ago with 15 apnea episodes during sleep study. Non-compliance with CPAP therapy may exacerbate secondary polycythemia due to intermittent nocturnal hypoxia.

## 2023-11-05 NOTE — Assessment & Plan Note (Signed)
 Smoking two packs per day, contributing to secondary polycythemia and elevated calcium score. Discussed smoking's impact on oxygen transport and the importance of cessation to reduce risks of cardiovascular and pulmonary complications. - Advise gradual smoking reduction with cessation goal in three months - Discuss risks of continued smoking, including cardiovascular and oncological risks

## 2023-11-17 ENCOUNTER — Other Ambulatory Visit: Payer: Self-pay | Admitting: Family Medicine

## 2023-11-18 ENCOUNTER — Other Ambulatory Visit: Payer: Self-pay | Admitting: Family Medicine

## 2023-11-18 ENCOUNTER — Telehealth: Admitting: Oncology

## 2023-11-18 MED ORDER — AMLODIPINE BESYLATE 10 MG PO TABS: 10.0000 mg | ORAL_TABLET | Freq: Every day | ORAL | 5 refills | Status: DC

## 2023-11-18 NOTE — Telephone Encounter (Signed)
 Copied from CRM [id number]. Topic: Clinical - Medication Refill >> Nov 18, 2023 11:58 AM [place] M wrote: Medication: amLODipine  (NORVASC ) 10 MG tablet  Has the patient contacted their pharmacy? Yes (Agent: If no, request that the patient contact the pharmacy for the refill. If patient does not wish to contact the pharmacy document the reason why and proceed with request.) (Agent: If yes, when and what did the pharmacy advise?)  This is the patient's preferred pharmacy:  [facility] [address], [place] - [address] [place] [address] Phone: [phone] Fax: [phone]  Is this the correct pharmacy for this prescription? Yes If no, delete pharmacy and type the correct one.   Has the prescription been filled recently? Yes  Is the patient out of the medication? Yes  Has the patient been seen for an appointment in the last year OR does the patient have an upcoming appointment? Yes  Can we respond through MyChart? Yes  Agent: Please be advised that Rx refills may take up to 3 business days. We ask that you follow-up with your pharmacy.

## 2023-12-03 ENCOUNTER — Other Ambulatory Visit: Payer: Self-pay | Admitting: Family Medicine

## 2024-01-09 ENCOUNTER — Other Ambulatory Visit: Payer: Self-pay | Admitting: Family Medicine

## 2024-02-02 ENCOUNTER — Inpatient Hospital Stay: Payer: Self-pay | Attending: Oncology

## 2024-02-02 ENCOUNTER — Inpatient Hospital Stay (HOSPITAL_BASED_OUTPATIENT_CLINIC_OR_DEPARTMENT_OTHER): Payer: Self-pay | Admitting: Oncology

## 2024-02-02 ENCOUNTER — Encounter: Payer: Self-pay | Admitting: Oncology

## 2024-02-02 VITALS — BP 121/82 | HR 77 | Temp 98.3°F | Resp 18 | Ht 70.0 in | Wt 187.3 lb

## 2024-02-02 DIAGNOSIS — F17210 Nicotine dependence, cigarettes, uncomplicated: Secondary | ICD-10-CM | POA: Insufficient documentation

## 2024-02-02 DIAGNOSIS — G4733 Obstructive sleep apnea (adult) (pediatric): Secondary | ICD-10-CM

## 2024-02-02 DIAGNOSIS — Z79899 Other long term (current) drug therapy: Secondary | ICD-10-CM | POA: Insufficient documentation

## 2024-02-02 DIAGNOSIS — D751 Secondary polycythemia: Secondary | ICD-10-CM

## 2024-02-02 DIAGNOSIS — F17200 Nicotine dependence, unspecified, uncomplicated: Secondary | ICD-10-CM

## 2024-02-02 LAB — CBC WITH DIFFERENTIAL (CANCER CENTER ONLY)
Abs Immature Granulocytes: 0.03 K/uL (ref 0.00–0.07)
Basophils Absolute: 0.1 K/uL (ref 0.0–0.1)
Basophils Relative: 2 %
Eosinophils Absolute: 0.2 K/uL (ref 0.0–0.5)
Eosinophils Relative: 5 %
HCT: 59.6 % — ABNORMAL HIGH (ref 39.0–52.0)
Hemoglobin: 19.1 g/dL — ABNORMAL HIGH (ref 13.0–17.0)
Immature Granulocytes: 1 %
Lymphocytes Relative: 19 %
Lymphs Abs: 0.9 K/uL (ref 0.7–4.0)
MCH: 25.7 pg — ABNORMAL LOW (ref 26.0–34.0)
MCHC: 32 g/dL (ref 30.0–36.0)
MCV: 80.1 fL (ref 80.0–100.0)
Monocytes Absolute: 0.4 K/uL (ref 0.1–1.0)
Monocytes Relative: 9 %
Neutro Abs: 3.3 K/uL (ref 1.7–7.7)
Neutrophils Relative %: 64 %
Platelet Count: 263 K/uL (ref 150–400)
RBC: 7.44 MIL/uL — ABNORMAL HIGH (ref 4.22–5.81)
RDW: 18 % — ABNORMAL HIGH (ref 11.5–15.5)
WBC Count: 5.1 K/uL (ref 4.0–10.5)
nRBC: 0 % (ref 0.0–0.2)

## 2024-02-02 NOTE — Assessment & Plan Note (Signed)
 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 Hemoglobin was 19.7 in May 2025, and hematocrit was 59, indicating elevated red cell mass.    This is a compensatory mechanism from low oxygen levels due to smoking and sleep apnea. Risks with elevated hematocrit include thrombosis, stroke, and cardiovascular complications. Target hematocrit is below 55 to mitigate these risks.     On his consultation with us  on 11/04/2023, labs revealed hemoglobin of 19.9, hematocrit 60.1.  White count 4700 with normal differential and platelet count normal at 239,000.   Patient is self-pay and deferred additional workup and this is reasonable.  He instead chose to donate blood at a blood bank, so as to bring down his hematocrit.  Donated blood once in July 2025.   He was strongly encouraged to quit smoking.  Currently smoking 2 packs/day.   - Encourage hydration with 80-90 ounces of water daily.   On follow-up on 02/02/2024, hemoglobin 19.1, hematocrit 59.6.  Patient chose to continue blood donations every 2 to 3 months.  RTC in 6 months for follow-up with repeat labs.

## 2024-02-02 NOTE — Progress Notes (Signed)
 [facility]  HEMATOLOGY CLINIC PROGRESS NOTE  PATIENT NAME: Brent Washington Person   MR#: [id number] DOB: [birth year]  Patient Care Team: Kennyth Worth HERO, MD as PCP - General (Family Medicine) O'Neal, Darryle Ned, MD as PCP - Cardiology (Cardiology)  Date of visit: 02/02/2024   ASSESSMENT & PLAN:   Brent Washington is a 43 y.o. 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  Polycythemia Clinical picture is consistent with secondary polycythemia, due to tobacco use and sleep apnea. Hemoglobin was 19.7 in May 2025, and hematocrit was 59, indicating elevated red cell mass.    This is a compensatory mechanism from low oxygen levels due to smoking and sleep apnea. Risks with elevated hematocrit include thrombosis, stroke, and cardiovascular complications. Target hematocrit is below 55 to mitigate these risks.     On his consultation with us  on 11/04/2023, labs revealed hemoglobin of 19.9, hematocrit 60.1.  White count 4700 with normal differential and platelet count normal at 239,000.   Patient is self-pay and deferred additional workup and this is reasonable.  He instead chose to donate blood at a blood bank, so as to bring down his hematocrit.  Donated blood once in July 2025.   He was strongly encouraged to quit smoking.  Currently smoking 2 packs/day.   - Encourage hydration with 80-90 ounces of water daily.   On follow-up on 02/02/2024, hemoglobin 19.1, hematocrit 59.6.  Patient chose to continue blood donations every 2 to 3 months.  RTC in 6 months for follow-up with repeat labs.  Nicotine dependence with current use Smoking two packs per day, contributing to secondary polycythemia and elevated calcium score. Discussed smoking's  impact on oxygen transport and the importance of cessation to reduce risks of cardiovascular and pulmonary complications. - Advise gradual smoking reduction with cessation goal in three months - Discuss risks of continued smoking, including cardiovascular and oncological risks  OSA (obstructive sleep apnea) Diagnosed two years ago with 15 apnea episodes during sleep study. Non-compliance with CPAP therapy may exacerbate secondary polycythemia due to intermittent nocturnal hypoxia.  Elevated coronary calcium score Elevated calcium score on recent CT coronary scan suggests potential coronary artery disease. No severe angina or significant dyspnea, but exertional dyspnea present. Cardiology referral recommended for further evaluation. - His PCP referred him to cardiology for further evaluation and testing  I spent a total of 20 minutes during this encounter with the patient including review of chart and various tests results, discussions about plan of care and coordination of care plan.  I reviewed lab results and outside records for this visit and discussed relevant results with the patient. Diagnosis, plan of care and treatment options were also discussed in detail with the patient. Opportunity provided to ask questions and answers provided to his apparent satisfaction. Provided instructions to call our clinic with any problems, questions or concerns prior to return visit. I recommended to continue follow-up with PCP and sub-specialists. He verbalized understanding and agreed with the plan. No barriers to learning was detected.  Chinita Patten, MD  02/02/2024 10:33 AM  [facility] - A DEPT OF [facility] [address] [place] [address] Dept: [phone] Dept Fax: [phone]   CHIEF COMPLAINT/ REASON FOR VISIT:  Follow-up for polycythemia, likely secondary  to nicotine dependence and untreated OSA  INTERVAL  HISTORY:  Discussed the use of AI scribe software for clinical note transcription with the patient, who gave verbal consent to proceed.  History of Present Illness Brent Washington is a 43 year old male who presents for follow-up regarding elevated hemoglobin levels.  He was able to donate blood once since his last visit three months ago, with no significant changes in his condition following the donation. His hemoglobin level was previously recorded at 19.9 and has decreased slightly to 19.1. During his blood donation, the hemoglobin was measured at 15, which he attributes to potential differences in equipment.  He continues to smoke over a pack and a half of cigarettes daily, despite efforts to cut down. He has tried using nicotine pouches but acknowledges the need to try harder to reduce his smoking. He confirms that he is staying hydrated.  He works in [employer]. He has not undergone bone marrow mutation testing or other tests due to cost concerns.    SUMMARY OF HEMATOLOGIC HISTORY:  He was referred by Dr. Kennyth for evaluation of high red blood cell count.   On 09/25/2023, routine labs at his PCPs office showed hemoglobin of 19.7, hematocrit 59.8, normal white count and platelet count.  This is the first time he has been informed of this issue. A recent CT coronary scan showed a high calcium score.   No chest pain or severe trouble breathing, but he experiences shortness of breath with exertion. No shortness of breath during regular walking. He has had a decreased appetite for quite some time but is maintaining his weight.   He was diagnosed with sleep apnea two years ago and was provided with a CPAP machine, which he has not been using. He had 15 episodes of apnea during his sleep study.   He smokes two packs of cigarettes a day and drinks alcohol. He reports staying well-hydrated, drinking a lot of water daily.  Clinical picture is consistent with secondary  polycythemia, due to tobacco use and sleep apnea. Hemoglobin was 19.7 in May 2025, and hematocrit was 59, indicating elevated red cell mass.    This is a compensatory mechanism from low oxygen levels due to smoking and sleep apnea. Risks with elevated hematocrit include thrombosis, stroke, and cardiovascular complications. Target hematocrit is below 55 to mitigate these risks.     On his consultation with us  on 11/04/2023, labs revealed hemoglobin of 19.9, hematocrit 60.1.  White count 4700 with normal differential and platelet count normal at 239,000.   Patient is self-pay and deferred additional workup and this is reasonable.  He instead chose to donate blood at a blood bank, so as to bring down his hematocrit.  Donated blood once in July 2025.   He was strongly encouraged to quit smoking.  Currently smoking 2 packs/day.   - Encourage hydration with 80-90 ounces of water daily.   On follow-up on 02/02/2024, hemoglobin 19.1, hematocrit 59.6.  Patient chose to continue blood donations every 2 to 3 months.  I have reviewed the past medical history, past surgical history, social history and family history with the patient and they are unchanged from previous note.  ALLERGIES: He is allergic to wellbutrin [bupropion].  MEDICATIONS:  Current Outpatient Medications  Medication Sig Dispense Refill   ALPRAZolam  (XANAX ) 0.5 MG tablet TAKE 1 TABLET BY MOUTH AT BEDTIME AS NEEDED FOR ANXIETY 30 tablet 0   amLODipine  (NORVASC ) 10 MG tablet Take 1 tablet (10 mg total)  by mouth daily. 30 tablet 5   aspirin EC 81 MG tablet Take 81 mg by mouth daily.     fluticasone (FLONASE) 50 MCG/ACT nasal spray Place 1 spray into both nostrils as needed for allergies or rhinitis.     losartan  (COZAAR ) 50 MG tablet TAKE 1 TABLET BY MOUTH ONCE DAILY . APPOINTMENT REQUIRED FOR FUTURE REFILLS 30 tablet 0   sertraline  (ZOLOFT ) 100 MG tablet TAKE 1 TABLET BY MOUTH ONCE DAILY . APPOINTMENT REQUIRED FOR FUTURE REFILLS (Patient  taking differently: Take 50 mg by mouth daily.) 90 tablet 3   tadalafil  (CIALIS ) 10 MG tablet TAKE 1/2 TO 1 (ONE-HALF TO ONE) TABLET BY MOUTH ONCE DAILY AS NEEDED FOR ERECTILE DYSFUNCTION 10 tablet 0   No current facility-administered medications for this visit.     REVIEW OF SYSTEMS:    Review of Systems - Oncology  All other pertinent systems were reviewed with the patient and are negative.  PHYSICAL EXAMINATION:    Onc Performance Status - 02/02/24 0800       ECOG Perf Status   ECOG Perf Status Fully active, able to carry on all pre-disease performance without restriction      KPS SCALE   KPS % SCORE Able to carry on normal activity, minor s/s of disease          Vitals:   02/02/24 0835 02/02/24 0836  BP: (!) 142/93 121/82  Pulse: 77   Resp: 18   Temp: 98.3 F (36.8 C)   SpO2: 98%    Filed Weights   02/02/24 0835  Weight: 187 lb 4.8 oz (85 kg)    Physical Exam Constitutional:      General: He is not in acute distress.    Appearance: Normal appearance.  HENT:     Head: Normocephalic and atraumatic.  Eyes:     Conjunctiva/sclera: Conjunctivae normal.  Cardiovascular:     Rate and Rhythm: Normal rate and regular rhythm.  Pulmonary:     Effort: Pulmonary effort is normal. No respiratory distress.  Abdominal:     General: There is no distension.  Neurological:     General: No focal deficit present.     Mental Status: He is alert and oriented to person, place, and time.  Psychiatric:        Mood and Affect: Mood normal.        Behavior: Behavior normal.     LABORATORY DATA:   I have reviewed the data as listed.  Results for orders placed or performed in visit on 02/02/24  CBC with Differential (Cancer Center Only)  Result Value Ref Range   WBC Count 5.1 4.0 - 10.5 K/uL   RBC 7.44 (H) 4.22 - 5.81 MIL/uL   Hemoglobin 19.1 (H) 13.0 - 17.0 g/dL   HCT 40.3 (H) 60.9 - 47.9 %   MCV 80.1 80.0 - 100.0 fL   MCH 25.7 (L) 26.0 - 34.0 pg   MCHC 32.0 30.0  - 36.0 g/dL   RDW 81.9 (H) 88.4 - 84.4 %   Platelet Count 263 150 - 400 K/uL   nRBC 0.0 0.0 - 0.2 %   Neutrophils Relative % 64 %   Neutro Abs 3.3 1.7 - 7.7 K/uL   Lymphocytes Relative 19 %   Lymphs Abs 0.9 0.7 - 4.0 K/uL   Monocytes Relative 9 %   Monocytes Absolute 0.4 0.1 - 1.0 K/uL   Eosinophils Relative 5 %   Eosinophils Absolute 0.2 0.0 - 0.5 K/uL  Basophils Relative 2 %   Basophils Absolute 0.1 0.0 - 0.1 K/uL   Immature Granulocytes 1 %   Abs Immature Granulocytes 0.03 0.00 - 0.07 K/uL    RADIOGRAPHIC STUDIES:  No recent pertinent imaging studies available to review.  Orders Placed This Encounter  Procedures   CBC with Differential (Cancer Center Only)    Standing Status:   Future    Expected Date:   08/01/2024    Expiration Date:   02/01/2025     Future Appointments  Date Time Provider Department Center  08/03/2024  8:00 AM [facility] PHLEBOTOMIST [facility] None  08/03/2024  8:30 AM Frannie Shedrick, Chinita, MD [facility] None     This document was completed utilizing speech recognition software. Grammatical errors, random word insertions, pronoun errors, and incomplete sentences are an occasional consequence of this system due to software limitations, ambient noise, and hardware issues. Any formal questions or concerns about the content, text or information contained within the body of this dictation should be directly addressed to the provider for clarification.

## 2024-02-02 NOTE — Assessment & Plan Note (Signed)
 Smoking two packs per day, contributing to secondary polycythemia and elevated calcium score. Discussed smoking's impact on oxygen transport and the importance of cessation to reduce risks of cardiovascular and pulmonary complications. - Advise gradual smoking reduction with cessation goal in three months - Discuss risks of continued smoking, including cardiovascular and oncological risks

## 2024-02-02 NOTE — Assessment & Plan Note (Signed)
 Diagnosed two years ago with 15 apnea episodes during sleep study. Non-compliance with CPAP therapy may exacerbate secondary polycythemia due to intermittent nocturnal hypoxia.

## 2024-02-03 ENCOUNTER — Other Ambulatory Visit

## 2024-02-03 ENCOUNTER — Ambulatory Visit: Admitting: Oncology

## 2024-02-05 ENCOUNTER — Other Ambulatory Visit: Payer: Self-pay | Admitting: Family Medicine

## 2024-03-08 ENCOUNTER — Telehealth: Payer: Self-pay | Admitting: Family Medicine

## 2024-03-08 MED ORDER — SERTRALINE HCL 100 MG PO TABS: ORAL_TABLET | ORAL | 3 refills | Status: AC

## 2024-03-08 MED ORDER — LOSARTAN POTASSIUM 50 MG PO TABS: 50.0000 mg | ORAL_TABLET | Freq: Every day | ORAL | 0 refills | Status: DC

## 2024-03-08 NOTE — Telephone Encounter (Signed)
 Copied from CRM [id number]. Topic: Clinical - Medication Refill >> Mar 08, 2024 11:26 AM Rea ORN wrote: Medication:  losartan  (COZAAR ) 50 MG tablet sertraline  (ZOLOFT ) 100 MG tablet  *Pt is completely out of Losartan  and needs today  Has the patient contacted their pharmacy? Yes (Agent: If no, request that the patient contact the pharmacy for the refill. If patient does not wish to contact the pharmacy document the reason why and proceed with request.) (Agent: If yes, when and what did the pharmacy advise?)  This is the patient's preferred pharmacy:  [id number] - [place], [place] - [address] [place] [address] Phone: [phone] Fax: [phone]  Is this the correct pharmacy for this prescription? Yes If no, delete pharmacy and type the correct one.   Has the prescription been filled recently? No  Is the patient out of the medication? Yes  Has the patient been seen for an appointment in the last year OR does the patient have an upcoming appointment? Yes  Can we respond through MyChart? Yes  Agent: Please be advised that Rx refills may take up to 3 business days. We ask that you follow-up with your pharmacy.

## 2024-03-29 ENCOUNTER — Other Ambulatory Visit: Payer: Self-pay | Admitting: Family Medicine

## 2024-04-05 ENCOUNTER — Other Ambulatory Visit: Payer: Self-pay | Admitting: Family Medicine

## 2024-04-05 ENCOUNTER — Telehealth: Payer: Self-pay

## 2024-04-05 ENCOUNTER — Other Ambulatory Visit: Payer: Self-pay

## 2024-04-05 ENCOUNTER — Encounter: Payer: Self-pay | Admitting: Family Medicine

## 2024-04-05 DIAGNOSIS — F419 Anxiety disorder, unspecified: Secondary | ICD-10-CM

## 2024-04-05 NOTE — Progress Notes (Signed)
 Medication refill request and new Pharmacy at [facility] on [address].

## 2024-04-05 NOTE — Telephone Encounter (Signed)
Med request sent

## 2024-04-05 NOTE — Telephone Encounter (Unsigned)
 Copied from CRM #[id number]. Topic: Clinical - Medication Refill >> Apr 05, 2024  3:12 PM Brent Washington wrote: Medication: ALPRAZolam  (XANAX ) 0.5 MG tablet  Has the patient contacted their pharmacy? Yes (Agent: If no, request that the patient contact the pharmacy for the refill. If patient does not wish to contact the pharmacy document the reason why and proceed with request.) (Agent: If yes, when and what did the pharmacy advise?)  This is the patient's preferred pharmacy:  [id number] - [place], [place] - [address] [place] [address] Phone: [phone] Fax: [phone]  Is this the correct pharmacy for this prescription? Yes If no, delete pharmacy and type the correct one.   Has the prescription been filled recently? No  Is the patient out of the medication? Yes  Has the patient been seen for an appointment in the last year OR does the patient have an upcoming appointment? Yes  Can we respond through MyChart? Yes  Agent: Please be advised that Rx refills may take up to 3 business days. We ask that you follow-up with your pharmacy.

## 2024-04-06 ENCOUNTER — Other Ambulatory Visit: Payer: Self-pay | Admitting: Family Medicine

## 2024-04-06 MED ORDER — ALPRAZOLAM 0.5 MG PO TABS: 0.5000 mg | ORAL_TABLET | Freq: Every evening | ORAL | 0 refills | Status: AC

## 2024-04-12 MED ORDER — ALPRAZOLAM 0.5 MG PO TABS: 0.5000 mg | ORAL_TABLET | Freq: Every evening | ORAL | 0 refills | Status: AC

## 2024-05-03 ENCOUNTER — Other Ambulatory Visit: Payer: Self-pay | Admitting: Family Medicine

## 2024-05-12 ENCOUNTER — Other Ambulatory Visit: Payer: Self-pay | Admitting: Family Medicine

## 2024-05-13 ENCOUNTER — Encounter: Payer: Self-pay | Admitting: Family Medicine

## 2024-05-14 MED ORDER — AMLODIPINE BESYLATE 10 MG PO TABS: 10.0000 mg | ORAL_TABLET | Freq: Every day | ORAL | 1 refills | Status: AC

## 2024-05-14 NOTE — Telephone Encounter (Signed)
 Medication refill has been sent to the pharmacy on file. Patient notified via MyChart and requested to schedule yearly sometime this year.

## 2024-05-17 ENCOUNTER — Telehealth: Payer: Self-pay | Admitting: Physician Assistant

## 2024-05-17 DIAGNOSIS — M546 Pain in thoracic spine: Secondary | ICD-10-CM

## 2024-05-17 MED ORDER — METHYLPREDNISOLONE 4 MG PO TBPK: ORAL_TABLET | ORAL | 0 refills | Status: AC

## 2024-05-17 MED ORDER — CYCLOBENZAPRINE HCL 10 MG PO TABS: 5.0000 mg | ORAL_TABLET | Freq: Three times a day (TID) | ORAL | 0 refills | Status: AC | PRN

## 2024-05-17 MED ORDER — NAPROXEN 500 MG PO TABS: 500.0000 mg | ORAL_TABLET | Freq: Two times a day (BID) | ORAL | 0 refills | Status: AC

## 2024-05-17 NOTE — Addendum Note (Signed)
 Addended by: VIVIENNE FORTUNATO HERO on: 05/17/2024 12:36 PM   Modules accepted: Orders

## 2024-05-17 NOTE — Progress Notes (Signed)
 We are sorry that you are not feeling well.  Here is how we plan to help!  Based on what you have shared with me it, appears you may be experiencing acute back pain.   Acute back pain is defined as musculoskeletal pain that can resolve in 1-3 weeks with conservative treatment.  I have prescribed a non-steroid anti-inflammatory (NSAID) Naprosyn  500 mg -- take one by mouth twice a day, as well as a muscle relaxant, Flexeril  10 mg every eight hours as needed. Some patients experience stomach irritation or in increased heartburn with anti-inflammatory drugs.  Please keep in mind that muscle relaxer's can cause fatigue and should not be taken while at work or driving.  Back pain is very common.  The pain often gets better over time.  The cause of back pain is usually not dangerous.  Most people can learn to manage their back pain on their own.  Home Care Stay active.  Start with short walks on flat ground if you can.  Try to walk farther each day. Do not sit, drive or stand in one place for more than 30 minutes.  Do not stay in bed. Do not fully avoid exercise or work.  Activity can help your back heal faster. Be careful when you bend or lift an object.  Bend at your knees, keep the object close to you, and do not twist. Sleep on a firm mattress.  Lie on your side, and bend your knees.  If you lie on your back, put a pillow under your knees. Only take medicines as told by your doctor. Put ice on the injured area. Put ice in a plastic bag Place a towel between your skin and the bag Leave the ice on for 15-20 minutes, 3-4 times a day for the first 2-3 days.  After that, you can switch between ice and heat packs. Ask your doctor about back exercises or massage.   Get Help Right Way If: Your pain does not go away with rest and treatments given today. Your pain does not go away within 1 week. You have new problems. You do not feel well. The pain spreads into your legs. You cannot control when you  poop (bowel movement) or pee (urinate). You feel sick to your stomach (nauseous) or throw up (vomit). You have belly (abdominal) pain. You feel like you may pass out (faint). If you develop a fever.  Make Sure you: Understand these instructions. Continue to monitor your condition for any changes. Will get help right away if you are not doing well or get worse.  Your e-visit answers were reviewed by a board certified advanced clinical practitioner to complete your personal care plan.  Depending on the condition, your plan could have included both over the counter or prescription medications.  If there is a problem, please reply once you have received a response from your provider.  Your safety is important to us .  If you have drug allergies, check your prescription carefully.    You can use MyChart to ask questions about todays visit, request a non-urgent call back, or ask for a work or school excuse for 24 hours related to this e-Visit. If it has been greater than 24 hours you will need to follow up with your provider or enter a new e-Visit to address those concerns.  You will get an e-mail in the next two days asking about your experience.  I hope that your e-visit has been valuable and will speed your  recovery. Thank you for using e-visits.   I have spent 5 minutes in review of e-visit questionnaire, review and updating patient chart, medical decision making and response to patient.   Delon CHRISTELLA Dickinson, PA-C

## 2024-05-21 MED ORDER — METHYLPREDNISOLONE 4 MG PO TABS: 4.0000 mg | ORAL_TABLET | Freq: Every day | ORAL | 0 refills | Status: AC

## 2024-05-21 NOTE — Addendum Note (Signed)
 Addended by: ALMEDA DEGREE on: 05/21/2024 01:43 PM   Modules accepted: Orders

## 2024-06-02 ENCOUNTER — Other Ambulatory Visit: Payer: Self-pay | Admitting: Family Medicine

## 2024-06-02 ENCOUNTER — Encounter: Payer: Self-pay | Admitting: Family Medicine

## 2024-08-03 ENCOUNTER — Other Ambulatory Visit

## 2024-08-03 ENCOUNTER — Ambulatory Visit: Admitting: Oncology
